# Patient Record
Sex: Female | Born: 1950 | Race: Black or African American | Hispanic: No | Marital: Single | Smoking: Never smoker
Health system: Southern US, Community
[De-identification: ages and names within clinical notes are randomized; demographics above are authoritative.]

## PROBLEM LIST (undated history)

## (undated) DIAGNOSIS — E049 Nontoxic goiter, unspecified: Secondary | ICD-10-CM

## (undated) DIAGNOSIS — E039 Hypothyroidism, unspecified: Secondary | ICD-10-CM

## (undated) DIAGNOSIS — T4145XA Adverse effect of unspecified anesthetic, initial encounter: Secondary | ICD-10-CM

## (undated) DIAGNOSIS — M1990 Unspecified osteoarthritis, unspecified site: Secondary | ICD-10-CM

## (undated) DIAGNOSIS — K7689 Other specified diseases of liver: Secondary | ICD-10-CM

## (undated) DIAGNOSIS — Z8679 Personal history of other diseases of the circulatory system: Secondary | ICD-10-CM

## (undated) DIAGNOSIS — Z9889 Other specified postprocedural states: Secondary | ICD-10-CM

## (undated) DIAGNOSIS — R112 Nausea with vomiting, unspecified: Secondary | ICD-10-CM

## (undated) DIAGNOSIS — E8809 Other disorders of plasma-protein metabolism, not elsewhere classified: Secondary | ICD-10-CM

## (undated) HISTORY — PX: HERNIA REPAIR: SHX51

## (undated) HISTORY — DX: Other specified diseases of liver: K76.89

## (undated) HISTORY — PX: ANKLE SURGERY: SHX546

## (undated) HISTORY — DX: Nontoxic goiter, unspecified: E04.9

## (undated) HISTORY — DX: Personal history of other diseases of the circulatory system: Z86.79

## (undated) HISTORY — DX: Unspecified osteoarthritis, unspecified site: M19.90

---

## 2009-10-13 DIAGNOSIS — K7689 Other specified diseases of liver: Secondary | ICD-10-CM

## 2009-10-13 HISTORY — DX: Other specified diseases of liver: K76.89

## 2010-10-15 LAB — HM COLONOSCOPY

## 2015-06-19 LAB — HM MAMMOGRAPHY

## 2015-10-10 ENCOUNTER — Telehealth: Payer: Self-pay | Admitting: Family Medicine

## 2015-10-10 NOTE — Telephone Encounter (Signed)
Medical records received and being sent back appt 10/22/15

## 2015-10-22 ENCOUNTER — Encounter: Payer: Self-pay | Admitting: Internal Medicine

## 2015-10-22 ENCOUNTER — Encounter: Payer: Self-pay | Admitting: Family Medicine

## 2015-10-22 ENCOUNTER — Ambulatory Visit (INDEPENDENT_AMBULATORY_CARE_PROVIDER_SITE_OTHER): Payer: Federal, State, Local not specified - PPO | Admitting: Family Medicine

## 2015-10-22 VITALS — BP 124/90 | HR 64 | Ht 66.0 in | Wt 162.0 lb

## 2015-10-22 DIAGNOSIS — M545 Low back pain, unspecified: Secondary | ICD-10-CM | POA: Insufficient documentation

## 2015-10-22 DIAGNOSIS — E049 Nontoxic goiter, unspecified: Secondary | ICD-10-CM

## 2015-10-22 DIAGNOSIS — Z7189 Other specified counseling: Secondary | ICD-10-CM

## 2015-10-22 DIAGNOSIS — G8929 Other chronic pain: Secondary | ICD-10-CM

## 2015-10-22 DIAGNOSIS — Z7689 Persons encountering health services in other specified circumstances: Secondary | ICD-10-CM

## 2015-10-22 NOTE — Patient Instructions (Signed)
[facility] will contact you in the next few days.  Ache ibuprofen 800 mg 3 times daily with food. You can also continue taking Tylenol I recommend heat and stretching. Back Pain, Adult Back pain is very common in adults.The cause of back pain is rarely dangerous and the pain often gets better over time.The cause of your back pain may not be known. Some common causes of back pain include:  Strain of the muscles or ligaments supporting the spine.  Wear and tear (degeneration) of the spinal disks.  Arthritis.  Direct injury to the back. For many people, back pain may return. Since back pain is rarely dangerous, most people can learn to manage this condition on their own. HOME CARE INSTRUCTIONS Watch your back pain for any changes. The following actions may help to lessen any discomfort you are feeling:  Remain active. It is stressful on your back to sit or stand in one place for long periods of time. Do not sit, drive, or stand in one place for more than 30 minutes at a time. Take short walks on even surfaces as soon as you are able.Try to increase the length of time you walk each day.  Exercise regularly as directed by your health care provider. Exercise helps your back heal faster. It also helps avoid future injury by keeping your muscles strong and flexible.  Do not stay in bed.Resting more than 1-2 days can delay your recovery.  Pay attention to your body when you bend and lift. The most comfortable positions are those that put less stress on your recovering back. Always use proper lifting techniques, including:  Bending your knees.  Keeping the load close to your body.  Avoiding twisting.  Find a comfortable position to sleep. Use a firm mattress and lie on your side with your knees slightly bent. If you lie on your back, put a pillow under your knees.  Avoid feeling anxious or stressed.Stress increases muscle tension and can worsen back pain.It is  important to recognize when you are anxious or stressed and learn ways to manage it, such as with exercise.  Take medicines only as directed by your health care provider. Over-the-counter medicines to reduce pain and inflammation are often the most helpful.Your health care provider may prescribe muscle relaxant drugs.These medicines help dull your pain so you can more quickly return to your normal activities and healthy exercise.  Apply ice to the injured area:  Put ice in a plastic bag.  Place a towel between your skin and the bag.  Leave the ice on for 20 minutes, 2-3 times a day for the first 2-3 days. After that, ice and heat may be alternated to reduce pain and spasms.  Maintain a healthy weight. Excess weight puts extra stress on your back and makes it difficult to maintain good posture. SEEK MEDICAL CARE IF:  You have pain that is not relieved with rest or medicine.  You have increasing pain going down into the legs or buttocks.  You have pain that does not improve in one week.  You have night pain.  You lose weight.  You have a fever or chills. SEEK IMMEDIATE MEDICAL CARE IF:   You develop new bowel or bladder control problems.  You have unusual weakness or numbness in your arms or legs.  You develop nausea or vomiting.  You develop abdominal pain.  You feel faint.   This information is not intended to replace advice given to you by your health  care provider. Make sure you discuss any questions you have with your health care provider.   Document Released: 09/29/2005 Document Revised: 10/20/2014 Document Reviewed: 01/31/2014 Elsevier Interactive Patient Education [employer]
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
## 2015-11-01 ENCOUNTER — Encounter: Payer: Self-pay | Admitting: Family Medicine

## 2015-11-28 ENCOUNTER — Encounter: Payer: Self-pay | Admitting: Family Medicine

## 2015-12-06 ENCOUNTER — Telehealth: Payer: Self-pay | Admitting: Family Medicine

## 2015-12-06 NOTE — Telephone Encounter (Signed)
Pt called and was wondering about her handicap placard said she brought it at her first visit, and was wanting it signed so she could take it to the [facility], i didn't see it any where, and didn't see any notes anywhere about it, she states she brought it in with some other paperwork on her first visit and i put the paperwork back when she checked in that day so im not for sure where its at, or if she mentioned it that day, but she says she needs one filled out that she can not walk that far and she needs to park close. i looked in media to see if it was in there but didn't see it either. Pt was kinda upset that she hasn't heard anything about the letter.please advise patient. Pt can be reached at [phone] told pt you was out of the office today and it would be tomorrow before someone would get back to her.

## 2015-12-07 NOTE — Telephone Encounter (Signed)
Left message for pt to call me back 

## 2015-12-07 NOTE — Telephone Encounter (Signed)
Please call and let her know that she and I discussed this at her last visit and that she did not meet criteria required for me to sign off on a handicap placard. In fact, she and I discussed how she is trying to walk more. I do not think this is appropriate and she does not meet the criteria that I would have to sign off on.

## 2015-12-11 NOTE — Telephone Encounter (Signed)
Left message for pt to call me back 

## 2015-12-14 ENCOUNTER — Encounter: Payer: Self-pay | Admitting: Family Medicine

## 2015-12-20 ENCOUNTER — Telehealth: Payer: Self-pay | Admitting: Family Medicine
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
## 2015-12-20 NOTE — Telephone Encounter (Signed)
Pt returned call to [place] °

## 2015-12-21 NOTE — Telephone Encounter (Signed)
I recommend that she come back in for an office visit to discuss starting her on medication for pain, I have not seen her since 10/22/2015. As I discussed with her in the office, she does not meet criteria to receive a handicap placard in this state and I an unable to sign off on her getting one. She and I discussed that she is actually walking quite a bit and that this is good for her to keep doing this.

## 2015-12-21 NOTE — Telephone Encounter (Signed)
Pt was told of Angela Patrick's recommendations and got upset as she doesn't meet the criteria for handicap placard and she has had one 2-3 times before and she just don't understand why she had it then but not now. i said its noted in your chart that you are walking 5 miles a day, is that correct and she said "yes i try to walk 5 miles a day as she wanted me to go to PT and so im trying to do it on my own". i said well if you can walk 5 miles a day as you are stating you can, then you do not need a handicap placard. She said well i still am having pain when i walk and she said well i will just schedule an appt and will talk about it then.

## 2016-02-07 ENCOUNTER — Ambulatory Visit (INDEPENDENT_AMBULATORY_CARE_PROVIDER_SITE_OTHER): Payer: Federal, State, Local not specified - PPO

## 2016-02-07 ENCOUNTER — Ambulatory Visit (INDEPENDENT_AMBULATORY_CARE_PROVIDER_SITE_OTHER): Payer: Federal, State, Local not specified - PPO | Admitting: Physician Assistant

## 2016-02-07 VITALS — BP 122/80 | HR 59 | Temp 98.2°F | Resp 16 | Ht 65.0 in | Wt 159.0 lb

## 2016-02-07 DIAGNOSIS — M542 Cervicalgia: Secondary | ICD-10-CM

## 2016-02-07 DIAGNOSIS — M5412 Radiculopathy, cervical region: Secondary | ICD-10-CM | POA: Diagnosis not present

## 2016-02-07 DIAGNOSIS — Z139 Encounter for screening, unspecified: Secondary | ICD-10-CM | POA: Diagnosis not present

## 2016-02-07 DIAGNOSIS — M47812 Spondylosis without myelopathy or radiculopathy, cervical region: Secondary | ICD-10-CM

## 2016-02-07 LAB — GLUCOSE, POCT (MANUAL RESULT ENTRY): POC Glucose: 103 mg/dl — AB (ref 70–99)

## 2016-02-07 MED ORDER — ACETAMINOPHEN 500 MG PO TABS: 1000.0000 mg | ORAL_TABLET | Freq: Three times a day (TID) | ORAL | Status: DC | PRN

## 2016-02-07 MED ORDER — PREDNISONE 20 MG PO TABS: ORAL_TABLET | ORAL | Status: AC

## 2016-02-07 MED ORDER — CYCLOBENZAPRINE HCL 10 MG PO TABS: 5.0000 mg | ORAL_TABLET | Freq: Three times a day (TID) | ORAL | Status: DC | PRN

## 2016-02-07 NOTE — Patient Instructions (Signed)
     IF you received an x-ray today, you will receive an invoice from [facility]. Please contact [facility] at [phone] with questions or concerns regarding your invoice.   IF you received labwork today, you will receive an invoice from [facility]. Please contact [facility] at [phone] with questions or concerns regarding your invoice.   Our billing staff will not be able to assist you with questions regarding bills from these companies.  You will be contacted with the lab results as soon as they are available. The fastest way to get your results is to activate your My Chart account. Instructions are located on the last page of this paperwork. If you have not heard from us regarding the results in 2 weeks, please contact this office.      

## 2016-02-07 NOTE — Progress Notes (Signed)
02/07/2016 12:59 PM   DOB: 09/18/1951 / MRN: [id number]  SUBJECTIVE:  Angela Patrick is a 65 y.o. female presenting for neck pain. Reports this started as a "crick in the neck." Reports over the last month she has developed some tingling in the left upper extremity.  Feels that she is getting worse.  She has tried OTC ibuprofen for the pain without relief.  No history of diabetes.   She is allergic to iodine and penicillins.   She  has a past medical history of Enlarged thyroid; Arthritis; Benign liver cyst (2011); Goiter; and History of orthostatic hypotension.    She  reports that she has never smoked. She has never used smokeless tobacco. She reports that she drinks alcohol. She reports that she does not use illicit drugs. She  has no sexual activity history on file. The patient  has past surgical history that includes Ankle surgery (Right).  Her family history includes AAA (abdominal aortic aneurysm) in her father; CAD in her mother; Cancer in her mother and sister; Goiter in her mother.  Review of Systems  Constitutional: Negative for fever and chills.  Eyes: Negative for blurred vision.  Respiratory: Negative for cough and shortness of breath.   Cardiovascular: Negative for chest pain.  Gastrointestinal: Negative for nausea and abdominal pain.  Genitourinary: Negative for dysuria, urgency and frequency.  Musculoskeletal: Positive for myalgias and neck pain.  Skin: Negative for rash.  Neurological: Positive for tingling. Negative for dizziness, sensory change, focal weakness and headaches.  Psychiatric/Behavioral: Negative for depression. The patient is not nervous/anxious.     Problem list and medications reviewed and updated by myself where necessary, and exist elsewhere in the encounter.   OBJECTIVE:  BP 122/80 mmHg  Pulse 59  Temp(Src) 98.2 F (36.8 C) (Oral)  Resp 16  Ht  (1.651 m)  Wt 159 lb (72.122 kg)  BMI 26.46 kg/m2  SpO2 98%  Physical Exam    Constitutional: She is oriented to person, place, and time. She appears well-developed and well-nourished.  Cardiovascular: Normal rate and regular rhythm.   Pulmonary/Chest: Effort normal and breath sounds normal.  Musculoskeletal:       Back:  Neurological: She is alert and oriented to person, place, and time. She has normal strength and normal reflexes. She displays no atrophy. No cranial nerve deficit or sensory deficit. She displays a negative Romberg sign. Gait normal. GCS eye subscore is 4. GCS verbal subscore is 5. GCS motor subscore is 6.  Skin: Skin is warm and dry.    Results for orders placed or performed in visit on 02/07/16 (from the past 72 hour(s))  POCT glucose (manual entry)     Status: Abnormal   Collection Time: 02/07/16 12:28 PM  Result Value Ref Range   POC Glucose 103 (A) 70 - 99 mg/dl    Dg Cervical Spine Complete  02/07/2016  CLINICAL DATA:  Right-sided neck discomfort with right upper extremity radicular symptoms. EXAM: CERVICAL SPINE - COMPLETE 4+ VIEW COMPARISON:  None in PACs FINDINGS: The cervical vertebral bodies are preserved in height. There is mild disc space narrowing at C6-7 and to a lesser extent at C5-6. There small anterior endplate osteophytes at C4-5 and C5-6. There is no spondylolisthesis. There is no perched facet or spinous process fracture. The oblique views reveal no significant bony encroachment upon the neural foramina. The odontoid is intact. The prevertebral soft tissue spaces are normal. IMPRESSION: There is degenerative disc disease centered at C5-6 and C6-7. There  is no compression fracture or significant bony encroachment upon the neural foramina. Electronically Signed   By: David  [place] M.D.   On: 02/07/2016 12:52    ASSESSMENT AND PLAN  Angela Patrick was seen today for back pain, arm pain, shoulder pain and other.  Diagnoses and all orders for this visit:  Neck pain: Her symptoms, exam, and rads are consistent with neck pain.  Will treat  with dose pack (non fasting sugar wnl today).  Advised Tylenol and advised that she stop taking Ibuprofen 800.  Of note patient has tried three of her friend's gabapentin and says this helped some.  This may be a consideration for treatment in the future.  Of note, patient did want me to complete a handicap placard for her today.  I advised it would be best for her PCP Angela Patrick to complete this information for her, as best I can tell the condition I treated today does not qualify her for a handicap placard as she is ambulating without difficulty.  -     DG Cervical Spine Complete; Future -     cyclobenzaprine (FLEXERIL) 10 MG tablet; Take 0.5-1 tablets (5-10 mg total) by mouth 3 (three) times daily as needed for muscle spasms (Do no operate heavy machinery while taking.).  Radiculopathy of cervical region -     DG Cervical Spine Complete; Future -     predniSONE (DELTASONE) 20 MG tablet; Take 3 in the morning for 3 days, then 2 in the morning for 3 days, and then 1 in the morning for 3 days.  Screening -     POCT glucose (manual entry)  Cervical spondylarthritis -     acetaminophen (TYLENOL) 500 MG tablet; Take 2 tablets (1,000 mg total) by mouth every 8 (eight) hours as needed.    The patient was advised to call or return to clinic if she does not see an improvement in symptoms or to seek the care of the closest emergency department if she worsens with the above plan.   Deliah BostonMichael Clark, MHS, PA-C [facility] 02/07/2016 12:59 PM

## 2016-09-25 ENCOUNTER — Encounter: Payer: Self-pay | Admitting: Family Medicine

## 2016-09-25 ENCOUNTER — Ambulatory Visit (INDEPENDENT_AMBULATORY_CARE_PROVIDER_SITE_OTHER): Payer: Federal, State, Local not specified - PPO | Admitting: Family Medicine

## 2016-09-25 ENCOUNTER — Ambulatory Visit: Payer: Federal, State, Local not specified - PPO | Admitting: Family Medicine

## 2016-09-25 ENCOUNTER — Ambulatory Visit
Admission: RE | Admit: 2016-09-25 | Discharge: 2016-09-25 | Disposition: A | Discharge status: Home / Self Care | Payer: Federal, State, Local not specified - PPO | Source: Ambulatory Visit | Attending: Family Medicine | Admitting: Family Medicine

## 2016-09-25 VITALS — BP 130/80 | HR 80 | Temp 98.4°F | Resp 16 | Wt 151.6 lb

## 2016-09-25 DIAGNOSIS — M25551 Pain in right hip: Secondary | ICD-10-CM

## 2016-09-25 DIAGNOSIS — R109 Unspecified abdominal pain: Secondary | ICD-10-CM | POA: Diagnosis not present

## 2016-09-25 DIAGNOSIS — J019 Acute sinusitis, unspecified: Secondary | ICD-10-CM

## 2016-09-25 DIAGNOSIS — R1031 Right lower quadrant pain: Secondary | ICD-10-CM | POA: Diagnosis not present

## 2016-09-25 LAB — POCT URINALYSIS DIPSTICK
BILIRUBIN UA: NEGATIVE
Glucose, UA: NEGATIVE
KETONES UA: NEGATIVE
LEUKOCYTES UA: NEGATIVE
Nitrite, UA: NEGATIVE
PH UA: 6
PROTEIN UA: NEGATIVE
RBC UA: NEGATIVE
Urobilinogen, UA: NEGATIVE

## 2016-09-25 MED ORDER — DOXYCYCLINE HYCLATE 100 MG PO TABS: 100.0000 mg | ORAL_TABLET | Freq: Two times a day (BID) | ORAL | 0 refills | Status: DC

## 2016-09-25 MED ORDER — IBUPROFEN 800 MG PO TABS: 800.0000 mg | ORAL_TABLET | Freq: Three times a day (TID) | ORAL | 1 refills | Status: DC

## 2016-09-25 NOTE — Patient Instructions (Signed)
Try taking Claritin, Allegra, Xyzal for sneezing and drainage. You can also try Flonase nasal spray. Stay well hydrated. Take the antibiotic until it's complete. If you are not back to baseline in regards to your sinus issues, let me know.    I am sending you for an XR and will call you with results. Take Ibuprofen 800 mg every 8 hours with food for the next couple of weeks and try heat to the area 2-3 times per day. Let me know if you are not improving or notice any new symptoms.    Sinusitis, Adult Sinusitis is soreness and inflammation of your sinuses. Sinuses are hollow spaces in the bones around your face. Your sinuses are located:  Around your eyes.  In the middle of your forehead.  Behind your nose.  In your cheekbones. Your sinuses and nasal passages are lined with a stringy fluid (mucus). Mucus normally drains out of your sinuses. When your nasal tissues become inflamed or swollen, the mucus can become trapped or blocked so air cannot flow through your sinuses. This allows bacteria, viruses, and funguses to grow, which leads to infection. Sinusitis can develop quickly and last for 7?10 days (acute) or for more than 12 weeks (chronic). Sinusitis often develops after a cold. What are the causes? This condition is caused by anything that creates swelling in the sinuses or stops mucus from draining, including:  Allergies.  Asthma.  Bacterial or viral infection.  Abnormally shaped bones between the nasal passages.  Nasal growths that contain mucus (nasal polyps).  Narrow sinus openings.  Pollutants, such as chemicals or irritants in the air.  A foreign object stuck in the nose.  A fungal infection. This is rare. What increases the risk? The following factors may make you more likely to develop this condition:  Having allergies or asthma.  Having had a recent cold or respiratory tract infection.  Having structural deformities or blockages in your nose or  sinuses.  Having a weak immune system.  Doing a lot of swimming or diving.  Overusing nasal sprays.  Smoking. What are the signs or symptoms? The main symptoms of this condition are pain and a feeling of pressure around the affected sinuses. Other symptoms include:  Upper toothache.  Earache.  Headache.  Bad breath.  Decreased sense of smell and taste.  A cough that may get worse at night.  Fatigue.  Fever.  Thick drainage from your nose. The drainage is often green and it may contain pus (purulent).  Stuffy nose or congestion.  Postnasal drip. This is when extra mucus collects in the throat or back of the nose.  Swelling and warmth over the affected sinuses.  Sore throat.  Sensitivity to light. How is this diagnosed? This condition is diagnosed based on symptoms, a medical history, and a physical exam. To find out if your condition is acute or chronic, your health care provider may:  Look in your nose for signs of nasal polyps.  Tap over the affected sinus to check for signs of infection.  View the inside of your sinuses using an imaging device that has a light attached (endoscope). If your health care provider suspects that you have chronic sinusitis, you may also:  Be tested for allergies.  Have a sample of mucus taken from your nose (nasal culture) and checked for bacteria.  Have a mucus sample examined to see if your sinusitis is related to an allergy. If your sinusitis does not respond to treatment and it lasts  longer than 8 weeks, you may have an MRI or CT scan to check your sinuses. These scans also help to determine how severe your infection is. In rare cases, a bone biopsy may be done to rule out more serious types of fungal sinus disease. How is this treated? Treatment for sinusitis depends on the cause and whether your condition is chronic or acute. If a virus is causing your sinusitis, your symptoms will go away on their own within 10 days. You  may be given medicines to relieve your symptoms, including:  Topical nasal decongestants. They shrink swollen nasal passages and let mucus drain from your sinuses.  Antihistamines. These drugs block inflammation that is triggered by allergies. This can help to ease swelling in your nose and sinuses.  Topical nasal corticosteroids. These are nasal sprays that ease inflammation and swelling in your nose and sinuses.  Nasal saline washes. These rinses can help to get rid of thick mucus in your nose. If your condition is caused by bacteria, you will be given an antibiotic medicine. If your condition is caused by a fungus, you will be given an antifungal medicine. Surgery may be needed to correct underlying conditions, such as narrow nasal passages. Surgery may also be needed to remove polyps. Follow these instructions at home: Medicines  Take, use, or apply over-the-counter and prescription medicines only as told by your health care provider. These may include nasal sprays.  If you were prescribed an antibiotic medicine, take it as told by your health care provider. Do not stop taking the antibiotic even if you start to feel better. Hydrate and Humidify  Drink enough water to keep your urine clear or pale yellow. Staying hydrated will help to thin your mucus.  Use a cool mist humidifier to keep the humidity level in your home above 50%.  Inhale steam for 10-15 minutes, 3-4 times a day or as told by your health care provider. You can do this in the bathroom while a hot shower is running.  Limit your exposure to cool or dry air. Rest  Rest as much as possible.  Sleep with your head raised (elevated).  Make sure to get enough sleep each night. General instructions  Apply a warm, moist washcloth to your face 3-4 times a day or as told by your health care provider. This will help with discomfort.  Wash your hands often with soap and water to reduce your exposure to viruses and other  germs. If soap and water are not available, use hand sanitizer.  Do not smoke. Avoid being around people who are smoking (secondhand smoke).  Keep all follow-up visits as told by your health care provider. This is important. Contact a health care provider if:  You have a fever.  Your symptoms get worse.  Your symptoms do not improve within 10 days. Get help right away if:  You have a severe headache.  You have persistent vomiting.  You have pain or swelling around your face or eyes.  You have vision problems.  You develop confusion.  Your neck is stiff.  You have trouble breathing. This information is not intended to replace advice given to you by your health care provider. Make sure you discuss any questions you have with your health care provider. Document Released: 09/29/2005 Document Revised: 05/25/2016 Document Reviewed: 07/25/2015 Elsevier Interactive Patient Education  2017 [employer]

## 2016-09-25 NOTE — Progress Notes (Signed)
Subjective:    Patient ID: Angela Patrick, female    DOB: 03/28/1951, 65 y.o.   MRN: [id number]  HPI Chief Complaint  Patient presents with  . sick    sinus- since thanskgiving, sinus pressure, headache, cough,.   . side pain    right side pain and radiates down leg to back of hip   She is here with complaints of a 3 week history of sinus pressure, purulent nasal drainage, post nasal drainage, fatigue coughing at night. Sneezing a lot during the day.   Does not smoke. No history of asthma, bronchitis, pneumonia.   Has tried multiple OTC cold remedies.  Denies fever, chills, chest pain, palpitations, shortness of breathing, wheezing, N/V/D.   States she has noticed some urinary frequency but has been drinking probiotic tea. Denies dysuria, urgency.   Also complains of right lower abdominal pain since early November. Pain is constant and aching and occasionally shoots down the front of her right leg and around to her right flank. No known injury but she was coughing when pain started. Pain is worse with movement and laying on her right side at night. Pain is improved with rest.  Denies vaginal discharge. No changes in bowel habits.  Last colonoscopy was at age 65.   Reports history of hernia repair in the same area over 20 years ago.   Past Medical History:  Diagnosis Date  . Arthritis    osteoarthritis  . Benign liver cyst 2011  . Enlarged thyroid   . Goiter    ultrasound in 2014 with fine needle aspiration  . History of orthostatic hypotension    Reviewed allergies, medications, past medical, surgical,  and social history.   Review of Systems Pertinent positives and negatives in the history of present illness.     Objective:   Physical Exam  Constitutional: She is oriented to person, place, and time. She appears well-developed and well-nourished. She does not have a sickly appearance. No distress.  HENT:  Right Ear: Tympanic membrane and ear canal normal.  Left Ear:  Tympanic membrane and ear canal normal.  Nose: Mucosal edema and rhinorrhea present. Right sinus exhibits maxillary sinus tenderness and frontal sinus tenderness. Left sinus exhibits maxillary sinus tenderness and frontal sinus tenderness.  Mouth/Throat: Uvula is midline, oropharynx is clear and moist and mucous membranes are normal.  Eyes: Conjunctivae are normal.  Neck: Full passive range of motion without pain. Neck supple.  Cardiovascular: Normal rate, regular rhythm, normal heart sounds and intact distal pulses.   No LE edema  Pulmonary/Chest: Effort normal and breath sounds normal.  Abdominal: Soft. Normal appearance and bowel sounds are normal. There is no hepatosplenomegaly. There is tenderness in the right upper quadrant and right lower quadrant. There is no rigidity, no rebound, no guarding, no CVA tenderness, no tenderness at McBurney's point and negative Murphy's sign. No hernia.  Genitourinary:  Genitourinary Comments: declined  Musculoskeletal:       Right hip: She exhibits tenderness. She exhibits normal range of motion, normal strength and no swelling.  Pain to posterior and anterior right hip with internal and external rotation.   Lymphadenopathy:    She has no cervical adenopathy.       Right: No inguinal and no supraclavicular adenopathy present.       Left: No inguinal and no supraclavicular adenopathy present.  Neurological: She is alert and oriented to person, place, and time. She has normal strength. Gait normal.  Skin: Skin is warm and dry. No  rash noted. No pallor.  Psychiatric: She has a normal mood and affect. Her speech is normal and behavior is normal. Thought content normal.   BP 130/80   Pulse 80   Temp 98.4 F (36.9 C) (Oral)   Resp 16   Wt 151 lb 9.6 oz (68.8 kg)   SpO2 98%   BMI 25.23 kg/m   Urinalysis dipstick negative.      Assessment & Plan:  Acute sinusitis with symptoms > 10 days  Pain of right hip joint - Plan: DG HIP UNILAT WITH PELVIS  2-3 VIEWS RIGHT  Right lower quadrant abdominal pain - Plan: DG HIP UNILAT WITH PELVIS 2-3 VIEWS RIGHT  Right flank pain - Plan: POCT urinalysis dipstick   Discussed management of acute sinusitis with nasal spray and antihistamine. Prescribed Doxycycline. She will follow up if not back to baseline after completing the antibiotic. I do suspect an allergy component to her illness.   Plan to send her for an XR of her right hip. Recommend conservative treatment with heat and Ibuprofen 800 mg TID for the next 2 weeks. Will follow up pending XR result. She does have chronic lumbar pain but this seems to under control.  Discussed that her lower abdominal pain has been ongoing and unchanged and is most likely referred from her hip but discussed red flags of abdominal pain.  She will be due for a CPE after October 21 2016.

## 2016-09-26 ENCOUNTER — Telehealth: Payer: Self-pay | Admitting: Internal Medicine
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
## 2016-09-28 NOTE — Telephone Encounter (Signed)
Please make sure she knows that I sent in a prescription for Ibuprofen 800 mg at her last appointment. I am aware of the fact that she has a history of chronic back pain.  Find out if she has additional questions. Thanks.

## 2016-09-29 NOTE — Telephone Encounter (Signed)
Left message for pt to call me back 

## 2017-01-08 ENCOUNTER — Telehealth: Payer: Self-pay

## 2017-01-08 NOTE — Telephone Encounter (Signed)
[facility] [phone].

## 2017-01-19 ENCOUNTER — Other Ambulatory Visit: Payer: Self-pay | Admitting: Internal Medicine

## 2017-01-19 DIAGNOSIS — E042 Nontoxic multinodular goiter: Secondary | ICD-10-CM

## 2017-01-26 ENCOUNTER — Ambulatory Visit (HOSPITAL_COMMUNITY): Payer: Federal, State, Local not specified - PPO

## 2017-02-23 ENCOUNTER — Ambulatory Visit (HOSPITAL_COMMUNITY)
Admission: RE | Admit: 2017-02-23 | Discharge: 2017-02-23 | Disposition: A | Discharge status: Home / Self Care | Payer: Federal, State, Local not specified - PPO | Source: Ambulatory Visit | Attending: Internal Medicine | Admitting: Internal Medicine

## 2017-02-23 DIAGNOSIS — E042 Nontoxic multinodular goiter: Secondary | ICD-10-CM | POA: Insufficient documentation

## 2017-10-07 ENCOUNTER — Emergency Department (HOSPITAL_COMMUNITY)
Admission: EM | Admit: 2017-10-07 | Discharge: 2017-10-07 | Disposition: A | Discharge status: Home / Self Care | Payer: Federal, State, Local not specified - PPO | Attending: Physician Assistant | Admitting: Physician Assistant

## 2017-10-07 ENCOUNTER — Other Ambulatory Visit: Payer: Self-pay

## 2017-10-07 ENCOUNTER — Emergency Department (HOSPITAL_COMMUNITY): Payer: Federal, State, Local not specified - PPO

## 2017-10-07 ENCOUNTER — Encounter (HOSPITAL_COMMUNITY): Payer: Self-pay | Admitting: Emergency Medicine

## 2017-10-07 DIAGNOSIS — Y9389 Activity, other specified: Secondary | ICD-10-CM | POA: Insufficient documentation

## 2017-10-07 DIAGNOSIS — S199XXA Unspecified injury of neck, initial encounter: Secondary | ICD-10-CM | POA: Diagnosis present

## 2017-10-07 DIAGNOSIS — S39012A Strain of muscle, fascia and tendon of lower back, initial encounter: Secondary | ICD-10-CM | POA: Insufficient documentation

## 2017-10-07 DIAGNOSIS — Z79899 Other long term (current) drug therapy: Secondary | ICD-10-CM | POA: Diagnosis not present

## 2017-10-07 DIAGNOSIS — S161XXA Strain of muscle, fascia and tendon at neck level, initial encounter: Secondary | ICD-10-CM

## 2017-10-07 DIAGNOSIS — Y92410 Unspecified street and highway as the place of occurrence of the external cause: Secondary | ICD-10-CM | POA: Insufficient documentation

## 2017-10-07 DIAGNOSIS — Y998 Other external cause status: Secondary | ICD-10-CM | POA: Diagnosis not present

## 2017-10-07 MED ORDER — IBUPROFEN 800 MG PO TABS: 800.0000 mg | ORAL_TABLET | Freq: Three times a day (TID) | ORAL | 0 refills | Status: DC | PRN

## 2017-10-07 MED ORDER — CYCLOBENZAPRINE HCL 10 MG PO TABS: 10.0000 mg | ORAL_TABLET | Freq: Every day | ORAL | 0 refills | Status: DC

## 2017-10-07 MED ORDER — HYDROCODONE-ACETAMINOPHEN 5-325 MG PO TABS: 1.0000 | ORAL_TABLET | Freq: Four times a day (QID) | ORAL | 0 refills | Status: DC | PRN
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
## 2017-10-07 NOTE — Discharge Instructions (Signed)
Return here as needed.  Use ice and heat on the areas that are sore.  Follow-up with your primary doctor.

## 2017-10-07 NOTE — ED Notes (Signed)
Spoke with PA Laveda Normanran regarding patient and given orders for CT head/neck. Orders placed accordingly.

## 2018-02-10 DIAGNOSIS — T8859XA Other complications of anesthesia, initial encounter: Secondary | ICD-10-CM

## 2018-02-10 HISTORY — DX: Other complications of anesthesia, initial encounter: T88.59XA

## 2018-03-17 ENCOUNTER — Other Ambulatory Visit: Payer: Self-pay | Admitting: Internal Medicine

## 2018-03-17 DIAGNOSIS — Z808 Family history of malignant neoplasm of other organs or systems: Secondary | ICD-10-CM

## 2018-03-17 DIAGNOSIS — E042 Nontoxic multinodular goiter: Secondary | ICD-10-CM

## 2018-04-19 ENCOUNTER — Ambulatory Visit
Admission: RE | Admit: 2018-04-19 | Discharge: 2018-04-19 | Disposition: A | Discharge status: Home / Self Care | Payer: Federal, State, Local not specified - PPO | Source: Ambulatory Visit | Attending: Internal Medicine | Admitting: Internal Medicine

## 2018-04-19 DIAGNOSIS — E042 Nontoxic multinodular goiter: Secondary | ICD-10-CM

## 2018-04-19 DIAGNOSIS — Z808 Family history of malignant neoplasm of other organs or systems: Secondary | ICD-10-CM

## 2018-07-05 ENCOUNTER — Ambulatory Visit: Payer: Self-pay | Admitting: Surgery

## 2018-08-20 NOTE — Patient Instructions (Addendum)
Angela Patrick  08/20/2018   Your procedure is scheduled on: 08-26-18     Report to [facility] Main  Entrance    Report to Admitting at 7:30 AM    Call this number if you have problems the morning of surgery [phone]     Remember: Do not eat food or drink liquids :After Midnight.     Take these medicines the morning of surgery with A SIP OF WATER: None               BRUSH YOUR TEETH MORNING OF SURGERY AND RINSE YOUR MOUTH OUT, NO CHEWING GUM CANDY OR MINTS.                 You may not have any metal on your body including hair pins and              piercings  Do not wear jewelry, make-up, lotions, powders or perfumes, deodorant             Do not wear nail polish.  Do not shave  48 hours prior to surgery.     Do not bring valuables to the hospital. [facility] IS NOT             RESPONSIBLE   FOR VALUABLES.  Contacts, dentures or bridgework may not be worn into surgery.  Leave suitcase in the car. After surgery it may be brought to your room.   Special Instructions: N/A              Please read over the following fact sheets you were given: _____________________________________________________________________          [facility] - Preparing for Surgery Before surgery, you can play an important role.  Because skin is not sterile, your skin needs to be as free of germs as possible.  You can reduce the number of germs on your skin by washing with CHG (chlorahexidine gluconate) soap before surgery.  CHG is an antiseptic cleaner which kills germs and bonds with the skin to continue killing germs even after washing. Please DO NOT use if you have an allergy to CHG or antibacterial soaps.  If your skin becomes reddened/irritated stop using the CHG and inform your nurse when you arrive at Short Stay. Do not shave (including legs and underarms) for at least 48 hours prior to the first CHG shower.  You may shave your face/neck. Please follow these instructions  carefully:  1.  Shower with CHG Soap the night before surgery and the  morning of Surgery.  2.  If you choose to wash your hair, wash your hair first as usual with your  normal  shampoo.  3.  After you shampoo, rinse your hair and body thoroughly to remove the  shampoo.                           4.  Use CHG as you would any other liquid soap.  You can apply chg directly  to the skin and wash                       Gently with a scrungie or clean washcloth.  5.  Apply the CHG Soap to your body ONLY FROM THE NECK DOWN.   Do not use on face/ open  Wound or open sores. Avoid contact with eyes, ears mouth and genitals (private parts).                       Wash face,  Genitals (private parts) with your normal soap.             6.  Wash thoroughly, paying special attention to the area where your surgery  will be performed.  7.  Thoroughly rinse your body with warm water from the neck down.  8.  DO NOT shower/wash with your normal soap after using and rinsing off  the CHG Soap.                9.  Pat yourself dry with a clean towel.            10.  Wear clean pajamas.            11.  Place clean sheets on your bed the night of your first shower and do not  sleep with pets. Day of Surgery : Do not apply any lotions/deodorants the morning of surgery.  Please wear clean clothes to the hospital/surgery center.  FAILURE TO FOLLOW THESE INSTRUCTIONS MAY RESULT IN THE CANCELLATION OF YOUR SURGERY PATIENT SIGNATURE_________________________________  NURSE SIGNATURE__________________________________  ________________________________________________________________________

## 2018-08-23 ENCOUNTER — Ambulatory Visit (HOSPITAL_COMMUNITY)
Admission: RE | Admit: 2018-08-23 | Discharge: 2018-08-23 | Disposition: A | Discharge status: Home / Self Care | Payer: Federal, State, Local not specified - PPO | Source: Ambulatory Visit | Attending: Anesthesiology | Admitting: Anesthesiology

## 2018-08-23 ENCOUNTER — Encounter (HOSPITAL_COMMUNITY): Payer: Self-pay

## 2018-08-23 ENCOUNTER — Encounter (HOSPITAL_COMMUNITY): Payer: Self-pay | Admitting: Surgery

## 2018-08-23 ENCOUNTER — Encounter (HOSPITAL_COMMUNITY)
Admission: RE | Admit: 2018-08-23 | Discharge: 2018-08-23 | Disposition: A | Discharge status: Home / Self Care | Payer: Federal, State, Local not specified - PPO | Source: Ambulatory Visit | Attending: Surgery | Admitting: Surgery

## 2018-08-23 ENCOUNTER — Other Ambulatory Visit: Payer: Self-pay

## 2018-08-23 DIAGNOSIS — E042 Nontoxic multinodular goiter: Secondary | ICD-10-CM | POA: Diagnosis present

## 2018-08-23 DIAGNOSIS — Z01818 Encounter for other preprocedural examination: Secondary | ICD-10-CM | POA: Diagnosis present

## 2018-08-23 DIAGNOSIS — Z01811 Encounter for preprocedural respiratory examination: Secondary | ICD-10-CM

## 2018-08-23 HISTORY — DX: Adverse effect of unspecified anesthetic, initial encounter: T41.45XA

## 2018-08-23 LAB — BASIC METABOLIC PANEL
Anion gap: 7 (ref 5–15)
BUN: 16 mg/dL (ref 8–23)
CALCIUM: 9.1 mg/dL (ref 8.9–10.3)
CO2: 27 mmol/L (ref 22–32)
CREATININE: 0.98 mg/dL (ref 0.44–1.00)
Chloride: 106 mmol/L (ref 98–111)
GFR, EST NON AFRICAN AMERICAN: 59 mL/min — AB (ref >60)
Glucose, Bld: 98 mg/dL (ref 70–99)
Potassium: 3.8 mmol/L (ref 3.5–5.1)
SODIUM: 140 mmol/L (ref 135–145)

## 2018-08-23 LAB — CBC
HCT: 43.7 % (ref 36.0–46.0)
Hemoglobin: 14.2 g/dL (ref 12.0–15.0)
MCH: 28.8 pg (ref 26.0–34.0)
MCHC: 32.5 g/dL (ref 30.0–36.0)
MCV: 88.6 fL (ref 80.0–100.0)
PLATELETS: 319 10*3/uL (ref 150–400)
RBC: 4.93 MIL/uL (ref 3.87–5.11)
RDW: 12.9 % (ref 11.5–15.5)
WBC: 5.9 10*3/uL (ref 4.0–10.5)
nRBC: 0 % (ref 0.0–0.2)

## 2018-08-23 NOTE — H&P (Signed)
General Surgery [facility]  Angela Patrick DOB: 1951/08/29 Married / Language: English / Race: Black or African American Female  History of Present Illness   The patient is a 67 year old female who presents with a complaint of Enlarged thyroid.  CHIEF COMPLAINT: multinodular thyroid goiter with compressive symptoms  Patient is referred by Dr. Ronnette Hila for surgical evaluation and management of multinodular thyroid goiter with compressive symptoms. Patient's primary care physician is Dr. Leodis Sias. Patient has a history of long-standing multinodular thyroid goiter dating back to 2014. She was diagnosed by her physician in [place]. She underwent fine-needle aspiration biopsy and brought along with pathology report showing that this was benign. Patient has never been on thyroid medication. She has had no prior head or neck surgery. Thyroid has continued to enlarge. Most recent thyroid ultrasound performed in July 2019 shows an enlarged right thyroid lobe measuring 6.1 cm and an enlarged left thyroid lobe measuring 9.0 cm. There are multiple nodules but none of which were suspicious and require biopsy. Most recent TSH level is normal at 0.97. I describes compressive symptoms including nighttime shortness of breath and choking, globus sensation, chronic hoarseness, left ear pain, and solid food dysphagia. There is a family history of thyroid disease in the patient's sister who required treatment with radioactive iodine and the patient's mother who developed thyroid cancer. Patient presents today to discuss total thyroidectomy for management of multinodular thyroid goiter with compressive symptoms.   Past Surgical History Foot Surgery  Right.  Diagnostic Studies History Colonoscopy  5-10 years ago Mammogram  within last year Pap Smear  1-5 years ago  Allergies  Penicillins  Swelling. Betadine  Swelling.  Medication History Multivitamin (Oral)  Active. Turmeric (400MG  Capsule, Oral) Active. Medications Reconciled  Social History Alcohol use  Moderate alcohol use. Caffeine use  Coffee. No drug use  Tobacco use  Never smoker.  Family History Alcohol Abuse  Brother, Father, Mother, Sister. Arthritis  Brother, Family Members In General, Mother, Sister. Cancer  Mother. Cerebrovascular Accident  Family Members In General. Depression  Son. Diabetes Mellitus  Family Members In General. Heart Disease  Family Members In General. Heart disease in female family member before age 21  Hypertension  Brother, Family Members In [place], Father, Mother, Sister. Migraine Headache  Sister, Son. Seizure disorder  Sister, Son. Thyroid problems  Mother, Sister.  Pregnancy / Birth History Age at menarche  18 years. Contraceptive History  Oral contraceptives. Gravida  2 Irregular periods  Length (months) of breastfeeding  3-6 Maternal age  49-25 Para  2  Other Problems Anxiety Disorder  Arthritis  General anesthesia - complications  Hemorrhoids  Migraine Headache    Review of Systems General Present- Appetite Loss. Not Present- Chills, Fatigue, Fever, Night Sweats, Weight Gain and Weight Loss. Skin Not Present- Change in Wart/Mole, Dryness, Hives, Jaundice, New Lesions, Non-Healing Wounds, Rash and Ulcer. HEENT Present- Earache, Hoarseness, Ringing in the Ears, Seasonal Allergies, Sinus Pain, Sore Throat and Wears glasses/contact lenses. Not Present- Hearing Loss, Nose Bleed, Oral Ulcers, Visual Disturbances and Yellow Eyes. Respiratory Present- Difficulty Breathing and Snoring. Not Present- Bloody sputum, Chronic Cough and Wheezing. Cardiovascular Present- Difficulty Breathing Lying Down, Leg Cramps, Palpitations, Rapid Heart Rate, Shortness of Breath and Swelling of Extremities. Not Present- Chest Pain. Gastrointestinal Present- Constipation, Difficulty Swallowing, Excessive gas, Gets full quickly at  meals, Hemorrhoids, Indigestion and Rectal Pain. Not Present- Abdominal Pain, Bloating, Bloody Stool, Change in Bowel Habits, Chronic diarrhea, Nausea and Vomiting. Female Genitourinary  Present- Frequency and Urgency. Not Present- Nocturia, Painful Urination and Pelvic Pain. Musculoskeletal Present- Back Pain, Joint Pain, Joint Stiffness, Muscle Pain, Muscle Weakness and Swelling of Extremities. Neurological Present- Headaches, Numbness, Tingling, Tremor, Trouble walking and Weakness. Not Present- Decreased Memory, Fainting and Seizures. Psychiatric Present- Anxiety, Change in Sleep Pattern, Fearful and Frequent crying. Not Present- Bipolar and Depression. Endocrine Present- Cold Intolerance and Hair Changes. Not Present- Excessive Hunger, Heat Intolerance, Hot flashes and New Diabetes. Hematology Present- Easy Bruising. Not Present- Blood Thinners, Excessive bleeding, Gland problems, HIV and Persistent Infections.  Vitals Weight: 160.6 lb Height: 65in Body Surface Area: 1.8 m Body Mass Index: 26.72 kg/m  Temp.: 97.82F(Temporal)  Pulse: 76 (Regular)  BP: 116/74 (Sitting, Left Arm, Standard)  Physical Exam  See vital signs recorded above  GENERAL APPEARANCE Development: normal Nutritional status: normal Gross deformities: none  SKIN Rash, lesions, ulcers: none Induration, erythema: none Nodules: none palpable  EYES Conjunctiva and lids: normal Pupils: equal and reactive Iris: normal bilaterally  EARS, NOSE, MOUTH, THROAT External ears: no lesion or deformity External nose: no lesion or deformity Hearing: grossly normal Lips: no lesion or deformity Dentition: normal for age Oral mucosa: moist  NECK Symmetric: no Trachea: Slight deviation to the right Thyroid: Thyroid gland is enlarged, soft, and multinodular. Right thyroid lobe is slightly enlarged and nontender to palpation. Left thyroid lobe measures approximately 7-8 cm in greatest dimension and extends  well into the left neck and possibly behind the left clavicle. Patient does have intermittent hoarseness of voice which occurs relatively frequently during the course of our conversation.  CHEST Respiratory effort: normal Retraction or accessory muscle use: no Breath sounds: normal bilaterally Rales, rhonchi, wheeze: none  CARDIOVASCULAR Auscultation: regular rhythm, normal rate Murmurs: none Pulses: carotid and radial pulse 2+ palpable Lower extremity edema: none Lower extremity varicosities: none  MUSCULOSKELETAL Station and gait: normal Digits and nails: no clubbing or cyanosis Muscle strength: grossly normal all extremities Range of motion: grossly normal all extremities Deformity: none  LYMPHATIC Cervical: none palpable Supraclavicular: none palpable  PSYCHIATRIC Oriented to person, place, and time: yes Mood and affect: normal for situation Judgment and insight: appropriate for situation    Assessment & Plan  MULTIPLE THYROID NODULES (E04.2)  Pt Education - Pamphlet Given - The Thyroid Book: discussed with patient and provided information. Patient is referred by her endocrinologist for consideration for total thyroidectomy for management of multinodular goiter with compressive symptoms. Patient is provided with written literature on thyroid surgery to review at home.  Patient has an enlarged multinodular thyroid gland, larger on the left than on the right, with mild to moderate compressive symptoms. We have discussed the indications for thyroid surgery. We have discussed the operation of total thyroidectomy in the location of the surgical incision. We have discussed risk and benefits of the surgery including the potential for injury to recurrent laryngeal nerves with permanent hoarseness and injury to parathyroid glands causing low calcium levels. We have discussed the hospital stay to be anticipated. We have discussed her postoperative recovery. We have discussed  the need for lifelong thyroid hormone replacement. Patient understands and wishes to proceed with surgery in the near future.  The risks and benefits of the procedure have been discussed at length with the patient. The patient understands the proposed procedure, potential alternative treatments, and the course of recovery to be expected. All of the patient's questions have been answered at this time. The patient wishes to proceed with surgery.  Darnell Level, MD [facility] Office: [phone]

## 2018-08-24 NOTE — Progress Notes (Signed)
08-24-18 Pt advised that her scheduled surgery time is now 7:31AM instead of 9:30 AM. Pt to report to Admitting at 5:30 AM, and to remain NPO after midnight. Pt verbalized understanding.

## 2018-08-26 ENCOUNTER — Encounter (HOSPITAL_COMMUNITY)
Admission: RE | Disposition: A | Discharge status: Home / Self Care | Payer: Self-pay | Source: Ambulatory Visit | Attending: Surgery

## 2018-08-26 ENCOUNTER — Encounter (HOSPITAL_COMMUNITY): Payer: Self-pay

## 2018-08-26 ENCOUNTER — Other Ambulatory Visit: Payer: Self-pay

## 2018-08-26 ENCOUNTER — Observation Stay (HOSPITAL_COMMUNITY)
Admission: RE | Admit: 2018-08-26 | Discharge: 2018-08-27 | Disposition: A | Discharge status: Home / Self Care | Payer: Federal, State, Local not specified - PPO | Source: Ambulatory Visit | Attending: Surgery | Admitting: Surgery

## 2018-08-26 ENCOUNTER — Ambulatory Visit (HOSPITAL_COMMUNITY): Payer: Federal, State, Local not specified - PPO | Admitting: Certified Registered Nurse Anesthetist

## 2018-08-26 DIAGNOSIS — Z888 Allergy status to other drugs, medicaments and biological substances status: Secondary | ICD-10-CM | POA: Diagnosis not present

## 2018-08-26 DIAGNOSIS — Z8249 Family history of ischemic heart disease and other diseases of the circulatory system: Secondary | ICD-10-CM | POA: Insufficient documentation

## 2018-08-26 DIAGNOSIS — Z8261 Family history of arthritis: Secondary | ICD-10-CM | POA: Diagnosis not present

## 2018-08-26 DIAGNOSIS — Z880 Allergy status to penicillin: Secondary | ICD-10-CM | POA: Diagnosis not present

## 2018-08-26 DIAGNOSIS — G43909 Migraine, unspecified, not intractable, without status migrainosus: Secondary | ICD-10-CM | POA: Insufficient documentation

## 2018-08-26 DIAGNOSIS — Z823 Family history of stroke: Secondary | ICD-10-CM | POA: Diagnosis not present

## 2018-08-26 DIAGNOSIS — Z811 Family history of alcohol abuse and dependence: Secondary | ICD-10-CM | POA: Insufficient documentation

## 2018-08-26 DIAGNOSIS — F419 Anxiety disorder, unspecified: Secondary | ICD-10-CM | POA: Diagnosis not present

## 2018-08-26 DIAGNOSIS — E042 Nontoxic multinodular goiter: Secondary | ICD-10-CM | POA: Diagnosis present

## 2018-08-26 DIAGNOSIS — M1990 Unspecified osteoarthritis, unspecified site: Secondary | ICD-10-CM | POA: Insufficient documentation

## 2018-08-26 DIAGNOSIS — Z818 Family history of other mental and behavioral disorders: Secondary | ICD-10-CM | POA: Insufficient documentation

## 2018-08-26 DIAGNOSIS — K649 Unspecified hemorrhoids: Secondary | ICD-10-CM | POA: Diagnosis not present

## 2018-08-26 DIAGNOSIS — Z809 Family history of malignant neoplasm, unspecified: Secondary | ICD-10-CM | POA: Insufficient documentation

## 2018-08-26 DIAGNOSIS — Z8349 Family history of other endocrine, nutritional and metabolic diseases: Secondary | ICD-10-CM | POA: Insufficient documentation

## 2018-08-26 DIAGNOSIS — Z820 Family history of epilepsy and other diseases of the nervous system: Secondary | ICD-10-CM | POA: Diagnosis not present

## 2018-08-26 DIAGNOSIS — E049 Nontoxic goiter, unspecified: Secondary | ICD-10-CM | POA: Diagnosis present

## 2018-08-26 HISTORY — PX: THYROIDECTOMY: SHX17

## 2018-08-26 SURGERY — THYROIDECTOMY
Anesthesia: General | Site: Neck

## 2018-08-26 MED ORDER — PROPOFOL 10 MG/ML IV BOLUS
INTRAVENOUS | Status: AC
  Filled 2018-08-26: qty 20

## 2018-08-26 MED ORDER — ONDANSETRON HCL 4 MG/2ML IJ SOLN
INTRAMUSCULAR | Status: DC | PRN
  Administered 2018-08-26: 4 mg via INTRAVENOUS

## 2018-08-26 MED ORDER — KCL IN DEXTROSE-NACL 20-5-0.45 MEQ/L-%-% IV SOLN
INTRAVENOUS | Status: DC
  Administered 2018-08-26 – 2018-08-27 (×2): via INTRAVENOUS
  Filled 2018-08-26 (×2): qty 1000

## 2018-08-26 MED ORDER — LEVOTHYROXINE SODIUM 88 MCG PO TABS: 88.0000 ug | ORAL_TABLET | Freq: Every day | ORAL | 3 refills | Status: DC

## 2018-08-26 MED ORDER — TRAMADOL HCL 50 MG PO TABS: 50.0000 mg | ORAL_TABLET | Freq: Four times a day (QID) | ORAL | 0 refills | Status: DC | PRN

## 2018-08-26 MED ORDER — SUGAMMADEX SODIUM 200 MG/2ML IV SOLN
INTRAVENOUS | Status: AC
  Filled 2018-08-26: qty 2

## 2018-08-26 MED ORDER — PHENYLEPHRINE HCL 10 MG/ML IJ SOLN
INTRAMUSCULAR | Status: AC
  Filled 2018-08-26: qty 1

## 2018-08-26 MED ORDER — FENTANYL CITRATE (PF) 100 MCG/2ML IJ SOLN
INTRAMUSCULAR | Status: AC
  Administered 2018-08-26: 50 ug via INTRAVENOUS
  Filled 2018-08-26: qty 2

## 2018-08-26 MED ORDER — FENTANYL CITRATE (PF) 100 MCG/2ML IJ SOLN
INTRAMUSCULAR | Status: DC | PRN
  Administered 2018-08-26 (×3): 50 ug via INTRAVENOUS

## 2018-08-26 MED ORDER — ONDANSETRON HCL 4 MG/2ML IJ SOLN
4.0000 mg | Freq: Four times a day (QID) | INTRAMUSCULAR | Status: DC | PRN
  Administered 2018-08-26 – 2018-08-27 (×2): 4 mg via INTRAVENOUS
  Filled 2018-08-26 (×2): qty 2

## 2018-08-26 MED ORDER — ROCURONIUM BROMIDE 50 MG/5ML IV SOSY
PREFILLED_SYRINGE | INTRAVENOUS | Status: DC | PRN
  Administered 2018-08-26: 40 mg via INTRAVENOUS

## 2018-08-26 MED ORDER — 0.9 % SODIUM CHLORIDE (POUR BTL) OPTIME
TOPICAL | Status: DC | PRN
  Administered 2018-08-26: 1000 mL

## 2018-08-26 MED ORDER — SUCCINYLCHOLINE CHLORIDE 200 MG/10ML IV SOSY
PREFILLED_SYRINGE | INTRAVENOUS | Status: DC | PRN
  Administered 2018-08-26: 120 mg via INTRAVENOUS

## 2018-08-26 MED ORDER — PHENYLEPHRINE 40 MCG/ML (10ML) SYRINGE FOR IV PUSH (FOR BLOOD PRESSURE SUPPORT)
PREFILLED_SYRINGE | INTRAVENOUS | Status: DC | PRN
  Administered 2018-08-26 (×2): 40 ug via INTRAVENOUS
  Administered 2018-08-26: 80 ug via INTRAVENOUS
  Administered 2018-08-26 (×2): 40 ug via INTRAVENOUS
  Administered 2018-08-26: 80 ug via INTRAVENOUS

## 2018-08-26 MED ORDER — HYDROCODONE-ACETAMINOPHEN 5-325 MG PO TABS: 1.0000 | ORAL_TABLET | ORAL | Status: DC | PRN

## 2018-08-26 MED ORDER — LIDOCAINE 2% (20 MG/ML) 5 ML SYRINGE
INTRAMUSCULAR | Status: AC
  Filled 2018-08-26: qty 5

## 2018-08-26 MED ORDER — ACETAMINOPHEN 325 MG PO TABS: 650.0000 mg | ORAL_TABLET | Freq: Four times a day (QID) | ORAL | Status: DC | PRN

## 2018-08-26 MED ORDER — MIDAZOLAM HCL 2 MG/2ML IJ SOLN
INTRAMUSCULAR | Status: AC
  Filled 2018-08-26: qty 2

## 2018-08-26 MED ORDER — DEXAMETHASONE SODIUM PHOSPHATE 10 MG/ML IJ SOLN
INTRAMUSCULAR | Status: DC | PRN
  Administered 2018-08-26: 10 mg via INTRAVENOUS

## 2018-08-26 MED ORDER — LIDOCAINE HCL 2 % IJ SOLN
INTRAMUSCULAR | Status: AC
  Filled 2018-08-26: qty 20

## 2018-08-26 MED ORDER — EPHEDRINE 5 MG/ML INJ
INTRAVENOUS | Status: AC
  Filled 2018-08-26: qty 10

## 2018-08-26 MED ORDER — EPHEDRINE SULFATE-NACL 50-0.9 MG/10ML-% IV SOSY
PREFILLED_SYRINGE | INTRAVENOUS | Status: DC | PRN
  Administered 2018-08-26: 10 mg via INTRAVENOUS
  Administered 2018-08-26: 5 mg via INTRAVENOUS

## 2018-08-26 MED ORDER — CIPROFLOXACIN IN D5W 400 MG/200ML IV SOLN
400.0000 mg | INTRAVENOUS | Status: AC
  Administered 2018-08-26: 400 mg via INTRAVENOUS

## 2018-08-26 MED ORDER — SUGAMMADEX SODIUM 200 MG/2ML IV SOLN
INTRAVENOUS | Status: DC | PRN
  Administered 2018-08-26: 200 mg via INTRAVENOUS

## 2018-08-26 MED ORDER — LIP MEDEX EX OINT
TOPICAL_OINTMENT | CUTANEOUS | Status: AC
  Administered 2018-08-26
  Filled 2018-08-26: qty 7

## 2018-08-26 MED ORDER — CHLORHEXIDINE GLUCONATE CLOTH 2 % EX PADS: 6.0000 | MEDICATED_PAD | Freq: Once | CUTANEOUS | Status: DC

## 2018-08-26 MED ORDER — FENTANYL CITRATE (PF) 250 MCG/5ML IJ SOLN
INTRAMUSCULAR | Status: AC
  Filled 2018-08-26: qty 5

## 2018-08-26 MED ORDER — PHENYLEPHRINE 40 MCG/ML (10ML) SYRINGE FOR IV PUSH (FOR BLOOD PRESSURE SUPPORT)
PREFILLED_SYRINGE | INTRAVENOUS | Status: AC
  Filled 2018-08-26: qty 10

## 2018-08-26 MED ORDER — SUCCINYLCHOLINE CHLORIDE 200 MG/10ML IV SOSY
PREFILLED_SYRINGE | INTRAVENOUS | Status: AC
  Filled 2018-08-26: qty 10

## 2018-08-26 MED ORDER — CALCIUM CARBONATE 1250 (500 CA) MG PO TABS
2.0000 | ORAL_TABLET | Freq: Three times a day (TID) | ORAL | Status: DC
  Administered 2018-08-27 (×2): 1000 mg via ORAL
  Filled 2018-08-26 (×2): qty 1

## 2018-08-26 MED ORDER — PROPOFOL 10 MG/ML IV BOLUS
INTRAVENOUS | Status: DC | PRN
  Administered 2018-08-26: 150 mg via INTRAVENOUS

## 2018-08-26 MED ORDER — ACETAMINOPHEN 650 MG RE SUPP: 650.0000 mg | Freq: Four times a day (QID) | RECTAL | Status: DC | PRN

## 2018-08-26 MED ORDER — LIDOCAINE 2% (20 MG/ML) 5 ML SYRINGE
INTRAMUSCULAR | Status: DC | PRN
  Administered 2018-08-26: 60 mg via INTRAVENOUS

## 2018-08-26 MED ORDER — CALCIUM CARBONATE ANTACID 500 MG PO CHEW: 2.0000 | CHEWABLE_TABLET | Freq: Three times a day (TID) | ORAL | 1 refills | Status: DC

## 2018-08-26 MED ORDER — CIPROFLOXACIN IN D5W 400 MG/200ML IV SOLN
INTRAVENOUS | Status: AC
  Filled 2018-08-26: qty 200

## 2018-08-26 MED ORDER — ROCURONIUM BROMIDE 10 MG/ML (PF) SYRINGE
PREFILLED_SYRINGE | INTRAVENOUS | Status: AC
  Filled 2018-08-26: qty 10

## 2018-08-26 MED ORDER — ONDANSETRON HCL 4 MG/2ML IJ SOLN
INTRAMUSCULAR | Status: AC
  Filled 2018-08-26: qty 2

## 2018-08-26 MED ORDER — ONDANSETRON 4 MG PO TBDP: 4.0000 mg | ORAL_TABLET | Freq: Four times a day (QID) | ORAL | Status: DC | PRN

## 2018-08-26 MED ORDER — DEXAMETHASONE SODIUM PHOSPHATE 10 MG/ML IJ SOLN
INTRAMUSCULAR | Status: AC
  Filled 2018-08-26: qty 1

## 2018-08-26 MED ORDER — TRAMADOL HCL 50 MG PO TABS
50.0000 mg | ORAL_TABLET | Freq: Four times a day (QID) | ORAL | Status: DC | PRN
  Administered 2018-08-26 – 2018-08-27 (×2): 50 mg via ORAL
  Filled 2018-08-26 (×2): qty 1

## 2018-08-26 MED ORDER — HYDROMORPHONE HCL 1 MG/ML IJ SOLN
1.0000 mg | INTRAMUSCULAR | Status: DC | PRN
  Administered 2018-08-26 (×3): 1 mg via INTRAVENOUS
  Filled 2018-08-26 (×3): qty 1

## 2018-08-26 MED ORDER — SODIUM CHLORIDE 0.9 % IV SOLN
INTRAVENOUS | Status: DC | PRN
  Administered 2018-08-26: 25 ug/min via INTRAVENOUS

## 2018-08-26 MED ORDER — KETAMINE HCL 10 MG/ML IJ SOLN
INTRAMUSCULAR | Status: AC
  Filled 2018-08-26: qty 1

## 2018-08-26 MED ORDER — LACTATED RINGERS IV SOLN
INTRAVENOUS | Status: DC
  Administered 2018-08-26: 1000 mL via INTRAVENOUS
  Administered 2018-08-26: 09:00:00 via INTRAVENOUS

## 2018-08-26 MED ORDER — FENTANYL CITRATE (PF) 100 MCG/2ML IJ SOLN
25.0000 ug | INTRAMUSCULAR | Status: DC | PRN
  Administered 2018-08-26: 50 ug via INTRAVENOUS

## 2018-08-26 MED ORDER — MIDAZOLAM HCL 5 MG/5ML IJ SOLN
INTRAMUSCULAR | Status: DC | PRN
  Administered 2018-08-26: 2 mg via INTRAVENOUS

## 2018-08-26 SURGICAL SUPPLY — 36 items
ATTRACTOMAT 16X20 MAGNETIC DRP (DRAPES) ×2 IMPLANT
BLADE SURG 15 STRL LF DISP TIS (BLADE) ×1 IMPLANT
BLADE SURG 15 STRL SS (BLADE) ×1
CHLORAPREP W/TINT 26ML (MISCELLANEOUS) ×4 IMPLANT
CLIP VESOCCLUDE MED 6/CT (CLIP) ×4 IMPLANT
CLIP VESOCCLUDE SM WIDE 6/CT (CLIP) ×4 IMPLANT
COVER SURGICAL LIGHT HANDLE (MISCELLANEOUS) ×2 IMPLANT
COVER WAND RF STERILE (DRAPES) ×2 IMPLANT
DERMABOND ADVANCED (GAUZE/BANDAGES/DRESSINGS) ×1
DERMABOND ADVANCED .7 DNX12 (GAUZE/BANDAGES/DRESSINGS) ×1 IMPLANT
DISSECTOR ROUND CHERRY 3/8 STR (MISCELLANEOUS) IMPLANT
DRAPE LAPAROTOMY T 98X78 PEDS (DRAPES) ×2 IMPLANT
ELECT PENCIL ROCKER SW 15FT (MISCELLANEOUS) ×2 IMPLANT
ELECT REM PT RETURN 15FT ADLT (MISCELLANEOUS) ×2 IMPLANT
GAUZE 4X4 16PLY RFD (DISPOSABLE) ×2 IMPLANT
GAUZE SPONGE 4X4 12PLY STRL (GAUZE/BANDAGES/DRESSINGS) IMPLANT
GLOVE SURG ORTHO 8.0 STRL STRW (GLOVE) ×2 IMPLANT
GOWN STRL REUS W/TWL XL LVL3 (GOWN DISPOSABLE) ×4 IMPLANT
HEMOSTAT SURGICEL 2X4 FIBR (HEMOSTASIS) ×2 IMPLANT
ILLUMINATOR WAVEGUIDE N/F (MISCELLANEOUS) ×2 IMPLANT
KIT BASIN OR (CUSTOM PROCEDURE TRAY) ×2 IMPLANT
LIGHT WAVEGUIDE WIDE FLAT (MISCELLANEOUS) IMPLANT
PACK BASIC VI WITH GOWN DISP (CUSTOM PROCEDURE TRAY) ×2 IMPLANT
POWDER SURGICEL 3.0 GRAM (HEMOSTASIS) IMPLANT
SHEARS HARMONIC 9CM CVD (BLADE) ×2 IMPLANT
STRIP CLOSURE SKIN 1/2X4 (GAUZE/BANDAGES/DRESSINGS) IMPLANT
SUT MNCRL AB 4-0 PS2 18 (SUTURE) ×2 IMPLANT
SUT SILK 2 0 (SUTURE)
SUT SILK 2-0 18XBRD TIE 12 (SUTURE) IMPLANT
SUT SILK 3 0 (SUTURE)
SUT SILK 3-0 18XBRD TIE 12 (SUTURE) IMPLANT
SUT VIC AB 3-0 SH 18 (SUTURE) ×4 IMPLANT
SYR BULB IRRIGATION 50ML (SYRINGE) ×2 IMPLANT
TOWEL OR 17X26 10 PK STRL BLUE (TOWEL DISPOSABLE) ×2 IMPLANT
TOWEL OR NON WOVEN STRL DISP B (DISPOSABLE) ×2 IMPLANT
YANKAUER SUCT BULB TIP 10FT TU (MISCELLANEOUS) ×2 IMPLANT

## 2018-08-26 NOTE — Anesthesia Procedure Notes (Signed)
Procedure Name: Intubation Date/Time: 08/26/2018 7:41 AM Performed by: Maxwell Caul, CRNA Pre-anesthesia Checklist: Patient identified, Emergency Drugs available, Suction available and Patient being monitored Patient Re-evaluated:Patient Re-evaluated prior to induction Oxygen Delivery Method: Circle system utilized Preoxygenation: Pre-oxygenation with 100% oxygen Induction Type: IV induction Ventilation: Mask ventilation without difficulty Laryngoscope Size: Mac and 4 Grade View: Grade I Tube type: Oral Tube size: 7.0 mm Number of attempts: 1 Airway Equipment and Method: Stylet Placement Confirmation: ETT inserted through vocal cords under direct vision,  positive ETCO2 and breath sounds checked- equal and bilateral Secured at: 21 cm Tube secured with: Tape Dental Injury: Teeth and Oropharynx as per pre-operative assessment

## 2018-08-26 NOTE — Anesthesia Postprocedure Evaluation (Signed)
Anesthesia Post Note  Patient: Angela Patrick  Procedure(s) Performed: TOTAL THYROIDECTOMY (N/A Neck)     Patient location during evaluation: PACU Anesthesia Type: General Level of consciousness: awake Pain management: pain level controlled Vital Signs Assessment: post-procedure vital signs reviewed and stable Respiratory status: spontaneous breathing Cardiovascular status: stable Anesthetic complications: no    Last Vitals:  Vitals:   08/26/18 0930 08/26/18 0945  BP: (!) 153/90 (!) 140/91  Pulse: 77 80  Resp: 13 16  Temp:    SpO2: 100% 100%    Last Pain:  Vitals:   08/26/18 0945  TempSrc:   PainSc: 7                  Brigette Hopfer

## 2018-08-26 NOTE — Anesthesia Preprocedure Evaluation (Addendum)
Anesthesia Evaluation  Patient identified by MRN, date of birth, ID band Patient awake    Reviewed: Allergy & Precautions, NPO status , Patient's Chart, lab work & pertinent test results  History of Anesthesia Complications (+) PSEUDOCHOLINESTERASE DEFICIENCY  Airway Mallampati: II  TM Distance: >3 FB     Dental   Pulmonary neg pulmonary ROS,    breath sounds clear to auscultation       Cardiovascular negative cardio ROS   Rhythm:Regular Rate:Normal     Neuro/Psych    GI/Hepatic negative GI ROS, Neg liver ROS,   Endo/Other  negative endocrine ROS  Renal/GU negative Renal ROS     Musculoskeletal  (+) Arthritis ,   Abdominal   Peds  Hematology   Anesthesia Other Findings   Reproductive/Obstetrics                            Anesthesia Physical Anesthesia Plan  ASA: II  Anesthesia Plan: General   Post-op Pain Management:    Induction: Intravenous  PONV Risk Score and Plan: Ondansetron, Dexamethasone and Midazolam  Airway Management Planned: Oral ETT  Additional Equipment:   Intra-op Plan:   Post-operative Plan:   Informed Consent: I have reviewed the patients History and Physical, chart, labs and discussed the procedure including the risks, benefits and alternatives for the proposed anesthesia with the patient or authorized representative who has indicated his/her understanding and acceptance.   Dental advisory given  Plan Discussed with: Anesthesiologist and CRNA  Anesthesia Plan Comments:        Anesthesia Quick Evaluation

## 2018-08-26 NOTE — Interval H&P Note (Signed)
History and Physical Interval Note:  08/26/2018 7:12 AM  Angela Patrick  has presented today for surgery, with the diagnosis of multinodular thyroid goiter.  The various methods of treatment have been discussed with the patient and family. After consideration of risks, benefits and other options for treatment, the patient has consented to    Procedure(s): TOTAL THYROIDECTOMY (N/A) as a surgical intervention .    The patient's history has been reviewed, patient examined, no change in status, stable for surgery.  I have reviewed the patient's chart and labs.  Questions were answered to the patient's satisfaction.    Darnell Levelodd Corry Storie, MD [facility] Office: [phone]    [name] MJudie Petit

## 2018-08-26 NOTE — Anesthesia Postprocedure Evaluation (Signed)
Anesthesia Post Note  Patient: Angela Patrick  Procedure(s) Performed: TOTAL THYROIDECTOMY (N/A Neck)     Patient location during evaluation: PACU Anesthesia Type: General Level of consciousness: awake Pain management: pain level controlled Vital Signs Assessment: post-procedure vital signs reviewed and stable Respiratory status: spontaneous breathing Cardiovascular status: stable Postop Assessment: no apparent nausea or vomiting Anesthetic complications: no    Last Vitals:  Vitals:   08/26/18 1015 08/26/18 1147  BP: (!) 136/93 119/77  Pulse: 80 78  Resp: 15 15  Temp: 36.9 C 36.6 C  SpO2: 100% 100%    Last Pain:  Vitals:   08/26/18 1147  TempSrc: Oral  PainSc:                  Sharunda Salmon

## 2018-08-26 NOTE — Op Note (Signed)
Procedure Note  Pre-operative Diagnosis:  Multinodular thyroid goiter with compressive symptoms  Post-operative Diagnosis:  same  Surgeon:  Darnell Levelodd Daylen Hack, MD  Assistant:  none   Procedure:  Total thyroidectomy  Anesthesia:  General  Estimated Blood Loss:  minimal  Drains: none         Specimen: thyroid to pathology  Indications:  Patient is referred by Dr. Ronnette HilaAvernini for surgical evaluation and management of multinodular thyroid goiter with compressive symptoms. Patient's primary care physician is Dr. Leodis SiasFrancis Wong. Patient has a history of long-standing multinodular thyroid goiter dating back to 2014. She was diagnosed [place] in [place] She underwent fine-needle aspiration biopsy and brought along with pathology report showing that this was benign. Patient has never been on thyroid medication. She has had no prior head or neck surgery. Thyroid has continued to enlarge. Most recent thyroid ultrasound performed in July 2019 shows an enlarged right thyroid lobe measuring 6.1 cm and an enlarged left thyroid lobe measuring 9.0 cm. There are multiple nodules but none of which were suspicious and require biopsy. Most recent TSH level is normal at 0.97. I describes compressive symptoms including nighttime shortness of breath and choking, globus sensation, chronic hoarseness, left ear pain, and solid food dysphagia. There is a family history of thyroid disease in the patient's sister who required treatment with radioactive iodine and the patient's mother who developed thyroid cancer. Patient presents today to discuss total thyroidectomy for management of multinodular thyroid goiter with compressive symptoms.  Procedure Details: [facility]  The patient was brought to the operating room and placed in a supine position on the operating room table.  Following administration of general anesthesia, the patient was positioned and then  prepped and draped in the usual aseptic fashion.  After ascertaining that an adequate level of anesthesia had been achieved, a Kocher incision was made with #15 blade.  Dissection was carried through subcutaneous tissues and platysma. Hemostasis was achieved with the electrocautery.  Skin flaps were elevated cephalad and caudad from the thyroid notch to the sternal notch.  The Mahorner self-retaining retractor was placed for exposure.  Strap muscles were incised in the midline and dissection was begun on the left side.  Strap muscles were reflected laterally.  Left thyroid lobe was markedly enlarged and multinodular.  The left lobe was gently mobilized with blunt dissection.  Superior pole vessels were dissected out and divided individually between small and medium Ligaclips with the Harmonic scalpel.  The thyroid lobe was rolled anteriorly.  Branches of the inferior thyroid artery were divided between small Ligaclips with the Harmonic scalpel.  Inferior venous tributaries were divided between Ligaclips.  Both the superior and inferior parathyroid glands were identified and preserved on their vascular pedicles.  The recurrent laryngeal nerve was identified and preserved along its course.  The ligament of Allyson SabalBerry was released with the electrocautery and the gland was mobilized onto the anterior trachea. Isthmus was mobilized across the midline.  There was no significant pyramidal lobe present.  Dry pack was placed in the left neck.  Next, the right thyroid lobe was gently mobilized with blunt dissection.  Right thyroid lobe was normal in size but contained small nodules.  Superior pole vessels were dissected out and divided between small and medium Ligaclips with the Harmonic scalpel.  Superior parathyroid was identified and preserved.  Inferior venous tributaries were divided between medium Ligaclips with the Harmonic scalpel.  The right thyroid lobe was  rolled anteriorly and the branches of the inferior thyroid  artery divided between small Ligaclips.  The right recurrent laryngeal nerve was identified and preserved along its course.  It was dissected off of the lateral aspect of the lobe and completely mobilized. The ligament of Allyson Sabal was released with the electrocautery.  The right thyroid lobe was mobilized onto the anterior trachea and the remainder of the thyroid was dissected off the anterior trachea and the thyroid was completely excised.  A suture was used to mark the left lobe. The entire thyroid gland was submitted to pathology for review.  The neck was irrigated with warm saline.  Fibrillar was placed throughout the operative field.  Strap muscles were reapproximated in the midline with interrupted 3-0 Vicryl sutures.  Platysma was closed with interrupted 3-0 Vicryl sutures.  Skin was closed with a running 4-0 Monocryl subcuticular suture.  Wound was washed and dried and Dermabond was applied. The patient was awakened from anesthesia and brought to the recovery room.  The patient tolerated the procedure well.   Darnell Level, MD [facility] Office: [phone]

## 2018-08-26 NOTE — Discharge Instructions (Signed)
[facility]  THYROID & PARATHYROID SURGERY:  POST-OP INSTRUCTIONS  Always review your discharge instruction sheet from the facility where your surgery was performed.  A prescription for pain medication may be given to you upon discharge.  Take your pain medication as prescribed.  If narcotic pain medicine is not needed, then you may take acetaminophen (Tylenol) or ibuprofen (Advil) as needed.  Take your usually prescribed medications unless otherwise directed.  If you need a refill on your pain medication, please contact our office during regular business hours.  Prescriptions cannot be processed by our office after 5 pm or on weekends.  Start with a light diet upon arrival home, such as soup and crackers or toast.  Be sure to drink plenty of fluids daily.  Resume your normal diet the day after surgery.  Most patients will experience some swelling and bruising on the chest and neck area.  Ice packs will help.  Swelling and bruising can take several days to resolve.   It is common to experience some constipation after surgery.  Increasing fluid intake and taking a stool softener (Colace) will usually help or prevent this problem.  A mild laxative (Milk of Magnesia or Miralax) should be taken according to package directions if there has been no bowel movement after 48 hours.  You have steri-strips and a gauze dressing over your incision.  You may remove the gauze bandage on the second day after surgery, and you may shower at that time.  Leave your steri-strips (small skin tapes) in place directly over the incision.  These strips should remain on the skin for 5-7 days and then be removed.  You may get them wet in the shower and pat them dry.  You may resume regular (light) daily activities beginning the next day (such as daily self-care, walking, climbing stairs) gradually increasing activities as tolerated.  You may have sexual intercourse when it is comfortable.  Refrain from  any heavy lifting or straining until approved by your doctor.  You may drive when you no longer are taking prescription pain medication, you can comfortably wear a seatbelt, and you can safely maneuver your car and apply brakes.  You should see your doctor in the office for a follow-up appointment approximately three weeks after your surgery.  Make sure that you call for this appointment within a day or two after you arrive home to insure a convenient appointment time.  WHEN TO CALL YOUR DOCTOR: -- Fever greater than 101.5 -- Inability to urinate -- Nausea and/or vomiting - persistent -- Extreme swelling or bruising -- Continued bleeding from incision -- Increased pain, redness, or drainage from the incision -- Difficulty swallowing or breathing -- Muscle cramping or spasms -- Numbness or tingling in hands or around lips  The clinic staff is available to answer your questions during regular business hours.  Please don't hesitate to call and ask to speak to one of the nurses if you have concerns.  Jazmarie Biever, MD [facility] Office: [phone] 

## 2018-08-26 NOTE — Transfer of Care (Signed)
Immediate Anesthesia Transfer of Care Note  Patient: Angela Patrick  Procedure(s) Performed: TOTAL THYROIDECTOMY (N/A Neck)  Patient Location: PACU  Anesthesia Type:General  Level of Consciousness: awake, alert  and oriented  Airway & Oxygen Therapy: Patient Spontanous Breathing and Patient connected to face mask oxygen  Post-op Assessment: Report given to RN and Post -op Vital signs reviewed and stable  Post vital signs: Reviewed and stable  Last Vitals:  Vitals Value Taken Time  BP 152/100 08/26/2018  9:24 AM  Temp    Pulse 82 08/26/2018  9:25 AM  Resp 19 08/26/2018  9:25 AM  SpO2 100 % 08/26/2018  9:25 AM  Vitals shown include unvalidated device data.  Last Pain:  Vitals:   08/26/18 0602  TempSrc:   PainSc: 2       Patients Stated Pain Goal: 4 (08/26/18 0602)  Complications: No apparent anesthesia complications

## 2018-08-27 ENCOUNTER — Encounter (HOSPITAL_COMMUNITY): Payer: Self-pay | Admitting: Surgery

## 2018-08-27 DIAGNOSIS — E042 Nontoxic multinodular goiter: Secondary | ICD-10-CM | POA: Diagnosis not present

## 2018-08-27 LAB — BASIC METABOLIC PANEL
ANION GAP: 8 (ref 5–15)
BUN: 11 mg/dL (ref 8–23)
CALCIUM: 8.2 mg/dL — AB (ref 8.9–10.3)
CO2: 24 mmol/L (ref 22–32)
Chloride: 107 mmol/L (ref 98–111)
Creatinine, Ser: 0.82 mg/dL (ref 0.44–1.00)
Glucose, Bld: 109 mg/dL — ABNORMAL HIGH (ref 70–99)
POTASSIUM: 3.7 mmol/L (ref 3.5–5.1)
Sodium: 139 mmol/L (ref 135–145)

## 2018-08-27 MED ORDER — CALCIUM GLUCONATE-NACL 2-0.675 GM/100ML-% IV SOLN
2.0000 g | INTRAVENOUS | Status: AC
  Administered 2018-08-27: 2000 mg via INTRAVENOUS
  Filled 2018-08-27: qty 100

## 2018-08-27 MED ORDER — GUAIFENESIN ER 600 MG PO TB12
600.0000 mg | ORAL_TABLET | Freq: Two times a day (BID) | ORAL | Status: DC
  Administered 2018-08-27: 600 mg via ORAL
  Filled 2018-08-27: qty 1

## 2018-08-27 NOTE — Progress Notes (Signed)
Discharge and medication instructions reviewed with patient. Questions answered and patient denies further questions. One prescription given to patient. Family members are here to drive patient home. Lina SarBeth Nylan Nakatani, RN

## 2018-08-27 NOTE — Discharge Summary (Signed)
[facility] Discharge Summary   Patient ID: Angela KinDelorse Patrick MRN: [id number] DOB/AGE: 66/06/1951 66 y.o.  Admit date: 08/26/2018 Discharge date: 08/27/2018  Admitting Diagnosis: Multinodular goiter  Discharge Diagnosis Patient Active Problem List   Diagnosis Date Noted  . Multinodular goiter (nontoxic) 08/26/2018  . Multiple thyroid nodules 08/23/2018  . 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 Enlarged thyroid 10/22/2015  . Chronic lumbar pain 10/22/2015    Consultants None  Imaging: No results found.  Procedures Dr. Gerrit FriendsGerkin (08/26/18) - Total thyroidectomy  Hospital Course:  Patient is a 67 year old female  who [facility]ide[facility]. Patient was admitted and underwent procedure listed above.  Tolerated procedure well and was transferred to the floor.  Diet was advanced as tolerated.  On POD1, the patient was voiding well, tolerating diet, ambulating well, pain well controlled, vital signs stable, incisions c/d/i and felt stable for discharge home.  Patient will follow up in our office in 3 weeks and knows to call with questions or concerns. She will call to confirm appointment date/time.     Allergies as of 08/27/2018      Reactions   Iodine Swelling   Penicillins Rash   Has patient had a PCN reaction causing immediate rash, facial/tongue/throat swelling, SOB or lightheadedness with hypotension: no Has patient had a PCN reaction causing severe rash involving mucus membranes or skin necrosis: no  Has patient had a PCN reaction that required hospitalization: no Has patient had a PCN reaction occurring within the last 10 years: no If all of the above answers are "NO", then may proceed with Cephalosporin use.      Medication List    STOP taking these medications   vitamin B-12 1000 MCG tablet Commonly known as:  CYANOCOBALAMIN     TAKE these medications   acetaminophen 500 MG tablet Commonly known as:  TYLENOL Take 2 tablets (1,000 mg  total) by mouth every 8 (eight) hours as needed.   Biotin 10 MG Tabs Take 1 tablet by mouth daily.   CALCIUM + D PO Take 1 tablet by mouth daily.   calcium carbonate 500 MG chewable tablet Commonly known as:  TUMS - dosed in mg elemental calcium Chew 2 tablets (400 mg of elemental calcium total) by mouth 3 (three) times daily.   CENTRUM SILVER ADULT 50+ PO Take 1 tablet by mouth every other day.   levothyroxine 88 MCG tablet Commonly known as:  SYNTHROID, LEVOTHROID Take 1 tablet (88 mcg total) by mouth daily before breakfast.   traMADol 50 MG tablet Commonly known as:  ULTRAM Take 1-2 tablets (50-100 mg total) by mouth every 6 (six) hours as needed.   vitamin C 1000 MG tablet Take 1,000 mg by mouth daily.        Follow-up Information    Darnell LevelGerkin, Todd, MD. Schedule an appointment as soon as possible for a visit in 3 weeks.   Specialty:  General Surgery Why:  For wound re-check Contact information: [address] [place] [address] [phone]           Signed: Wells GuilesKelly Rayburn, [facility] 08/27/2018, 12:23 PM Pager: [phone] Mon-Fri 7:00 am-4:30 pm Sat-Sun 7:00 am-11:30 am

## 2018-08-27 NOTE — Progress Notes (Signed)
[facility] Progress Note  1 Day Post-Op  Subjective: CC:  C/o some throat soreness and nausea this AM. Attributes nausea to tramadol/empty stomach. Mobilizing in her room.   Objective: Vital signs in last 24 hours: Temp:  [97.7 F (36.5 C)-98.8 F (37.1 C)] 98.7 F (37.1 C) (11/15 0517) Pulse Rate:  [59-81] 62 (11/15 0517) Resp:  [9-20] 16 (11/15 0517) BP: (98-153)/(71-100) 106/72 (11/15 0517) SpO2:  [97 %-100 %] 100 % (11/15 0517)    Intake/Output from previous day: 11/14 0701 - 11/15 0700 In: 2307.3 [P.O.:360; I.V.:1747.3; IV Piggyback:200] Out: 2320 [Urine:2300; Blood:20] Intake/Output this shift: Total I/O In: -  Out: 300 [Urine:300]  PE: Gen:  Alert, NAD, pleasant HEENT: incision clean and dry, no hematoma or erythema Card:  Regular rate and rhythm Pulm:  Normal effort, clear to auscultation bilaterally Abd: Soft Skin: warm and dry, no rashes  Psych: A&Ox3   Lab Results:  No results for input(s): WBC, HGB, HCT, PLT in the last 72 hours. BMET Recent Labs    08/27/18 0417  NA 139  K 3.7  CL 107  CO2 24  GLUCOSE 109*  BUN 11  CREATININE 0.82  CALCIUM 8.2*   PT/INR No results for input(s): LABPROT, INR in the last 72 hours. CMP     Component Value Date/Time   NA 139 08/27/2018 0417   K 3.7 08/27/2018 0417   CL 107 08/27/2018 0417   CO2 24 08/27/2018 0417   GLUCOSE 109 (H) 08/27/2018 0417   BUN 11 08/27/2018 0417   CREATININE 0.82 08/27/2018 0417   CALCIUM 8.2 (L) 08/27/2018 0417   GFRNONAA >60 08/27/2018 0417   GFRAA >60 08/27/2018 0417   Lipase  No results found for: LIPASE     Studies/Results: No results found.  Anti-infectives: Anti-infectives (From admission, onward)   Start     Dose/Rate Route Frequency Ordered Stop   08/26/18 0600  ciprofloxacin (CIPRO) IVPB 400 mg     400 mg 200 mL/hr over 60 Minutes Intravenous On call to O.R. 08/26/18 0549 08/26/18 0812   08/26/18 0552  ciprofloxacin (CIPRO) 400 MG/200ML IVPB     Note to Pharmacy:  Curlene DolphinWhitlow, Cheryl   : cabinet override      08/26/18 0552 08/26/18 0742       Assessment/Plan POD#1 s/p Total thyroidectomy 11/14 Dr. Gerrit FriendsGerkin - afebrile, VSS  - tolerating liquids - Calcium 8.2, give 2 g calcium gluconate - monitor nausea, can make medication adjustments if necessary - anticipate PM discharge after IV calcium gluconate   LOS: 0 days    Hosie SpangleElizabeth Teal Raben, [facility] Pager: [phone]

## 2018-08-30 NOTE — Progress Notes (Signed)
Please contact patient and notify of benign pathology results.  Jaquelin Meaney M. Manilla Strieter, MD, FACS [facility] Office: [phone]   

## 2018-12-20 ENCOUNTER — Other Ambulatory Visit (HOSPITAL_COMMUNITY): Payer: Self-pay | Admitting: Orthopedic Surgery

## 2019-01-06 ENCOUNTER — Ambulatory Visit (HOSPITAL_BASED_OUTPATIENT_CLINIC_OR_DEPARTMENT_OTHER)
Admission: RE | Admit: 2019-01-06 | Payer: Federal, State, Local not specified - PPO | Source: Home / Self Care | Admitting: Orthopedic Surgery

## 2019-01-06 ENCOUNTER — Encounter (HOSPITAL_BASED_OUTPATIENT_CLINIC_OR_DEPARTMENT_OTHER): Admission: RE | Payer: Self-pay | Source: Home / Self Care

## 2019-01-06 SURGERY — BUNIONECTOMY
Anesthesia: General | Laterality: Right

## 2019-02-21 ENCOUNTER — Other Ambulatory Visit (HOSPITAL_COMMUNITY): Payer: Self-pay | Admitting: Orthopedic Surgery

## 2019-02-24 ENCOUNTER — Other Ambulatory Visit: Payer: Self-pay

## 2019-02-24 ENCOUNTER — Encounter (HOSPITAL_BASED_OUTPATIENT_CLINIC_OR_DEPARTMENT_OTHER): Payer: Self-pay

## 2019-02-25 ENCOUNTER — Encounter (HOSPITAL_BASED_OUTPATIENT_CLINIC_OR_DEPARTMENT_OTHER): Payer: Self-pay

## 2019-02-25 NOTE — Anesthesia Preprocedure Evaluation (Addendum)
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   Reviewed: Allergy & Precautions, NPO status , Patient's Chart, lab work & pertinent test results  History of Anesthesia Complications (+) PSEUDOCHOLINESTERASE DEFICIENCY and history of anesthetic complications  Airway Mallampati: II  TM Distance: >3 FB Neck ROM: Full    Dental no notable dental hx. (+) Teeth Intact   Pulmonary neg pulmonary ROS,    Pulmonary exam normal breath sounds clear to auscultation       Cardiovascular negative cardio ROS Normal cardiovascular exam Rhythm:Regular Rate:Normal     Neuro/Psych negative neurological ROS  negative psych ROS   GI/Hepatic negative GI ROS, Neg liver ROS,   Endo/Other  Hx/o multinodular goiter S/P thyroidectomy on supplemental thyroid Rx  Renal/GU negative Renal ROS  negative genitourinary   Musculoskeletal  (+) Arthritis , Osteoarthritis,  Chronic back pain Left Bunion Left hammertoe Retained hardware left ankle   Abdominal   Peds  Hematology negative hematology ROS (+)   Anesthesia Other Findings   Reproductive/Obstetrics                             Anesthesia Physical Anesthesia Plan  ASA: II  Anesthesia Plan: General   Post-op Pain Management:  Regional for Post-op pain   Induction: Intravenous  PONV Risk Score and Plan: 4 or greater and Ondansetron, Dexamethasone and Treatment may vary due to age or medical condition  Airway Management Planned: LMA and Oral ETT  Additional Equipment:   Intra-op Plan:   Post-operative Plan: Extubation in OR  Informed Consent: I have reviewed the patients History and Physical, chart, labs and discussed the procedure including the risks, benefits and  alternatives for the proposed anesthesia with the patient or authorized representative who has indicated his/her understanding and acceptance.     Dental advisory given  Plan Discussed with: CRNA and Surgeon  Anesthesia Plan Comments: (ANESTHESIA CONSULT 02/25/2019. Patient with history of pseudocholinesterase deficiency by chart review of last anesthesia pre-op 08/2018.  Patient states she had an issue with palpitations during a dental procedure after being injected with local anesthesia which required prolonged monitoring at dental office and supplemental O2.  In November, had thyroidectomy and patient states she was difficult to arouse in PACU.  Chart review did not reveal any complications post-operatively.  Since thyroid surgery, patient states her throat feels tight in AM, but denies difficulty swallowing or breathing.  Discussed anesthesia plan for upcoming orthopedic surgery including likely nerve block for bunionectomy.  Patient questions answered.  Physical exam revealed no obvious airway abnormalities.  Patient will be re-evaluated on day of surgery.  Rondall Allegra, MD)       Anesthesia Quick Evaluation

## 2019-02-25 NOTE — Progress Notes (Signed)
Ensure pre surgical drink given to pt with instructions, pt verbalized understanding

## 2019-02-28 ENCOUNTER — Other Ambulatory Visit (HOSPITAL_COMMUNITY)
Admission: RE | Admit: 2019-02-28 | Discharge: 2019-02-28 | Disposition: A | Discharge status: Home / Self Care | Payer: Federal, State, Local not specified - PPO | Source: Ambulatory Visit | Attending: Orthopedic Surgery | Admitting: Orthopedic Surgery

## 2019-02-28 DIAGNOSIS — Z01812 Encounter for preprocedural laboratory examination: Secondary | ICD-10-CM | POA: Diagnosis present

## 2019-02-28 DIAGNOSIS — Z1159 Encounter for screening for other viral diseases: Secondary | ICD-10-CM | POA: Diagnosis not present

## 2019-03-01 LAB — NOVEL CORONAVIRUS, NAA (HOSP ORDER, SEND-OUT TO REF LAB; TAT 18-24 HRS): SARS-CoV-2, NAA: NOT DETECTED

## 2019-03-03 ENCOUNTER — Ambulatory Visit (HOSPITAL_BASED_OUTPATIENT_CLINIC_OR_DEPARTMENT_OTHER): Payer: Federal, State, Local not specified - PPO | Admitting: Anesthesiology

## 2019-03-03 ENCOUNTER — Encounter (HOSPITAL_BASED_OUTPATIENT_CLINIC_OR_DEPARTMENT_OTHER): Payer: Self-pay | Admitting: Anesthesiology

## 2019-03-03 ENCOUNTER — Encounter (HOSPITAL_BASED_OUTPATIENT_CLINIC_OR_DEPARTMENT_OTHER)
Admission: RE | Disposition: A | Discharge status: Home / Self Care | Payer: Self-pay | Source: Home / Self Care | Attending: Orthopedic Surgery

## 2019-03-03 ENCOUNTER — Ambulatory Visit (HOSPITAL_BASED_OUTPATIENT_CLINIC_OR_DEPARTMENT_OTHER)
Admission: RE | Admit: 2019-03-03 | Discharge: 2019-03-03 | Disposition: A | Discharge status: Home / Self Care | Payer: Federal, State, Local not specified - PPO | Attending: Orthopedic Surgery | Admitting: Orthopedic Surgery

## 2019-03-03 DIAGNOSIS — Z79899 Other long term (current) drug therapy: Secondary | ICD-10-CM | POA: Diagnosis not present

## 2019-03-03 DIAGNOSIS — L905 Scar conditions and fibrosis of skin: Secondary | ICD-10-CM | POA: Diagnosis not present

## 2019-03-03 DIAGNOSIS — Z79890 Hormone replacement therapy (postmenopausal): Secondary | ICD-10-CM | POA: Insufficient documentation

## 2019-03-03 DIAGNOSIS — M2041 Other hammer toe(s) (acquired), right foot: Secondary | ICD-10-CM | POA: Insufficient documentation

## 2019-03-03 DIAGNOSIS — M7742 Metatarsalgia, left foot: Secondary | ICD-10-CM | POA: Insufficient documentation

## 2019-03-03 DIAGNOSIS — Y831 Surgical operation with implant of artificial internal device as the cause of abnormal reaction of the patient, or of later complication, without mention of misadventure at the time of the procedure: Secondary | ICD-10-CM | POA: Insufficient documentation

## 2019-03-03 DIAGNOSIS — T8484XA Pain due to internal orthopedic prosthetic devices, implants and grafts, initial encounter: Secondary | ICD-10-CM | POA: Insufficient documentation

## 2019-03-03 DIAGNOSIS — Z888 Allergy status to other drugs, medicaments and biological substances status: Secondary | ICD-10-CM | POA: Diagnosis not present

## 2019-03-03 DIAGNOSIS — Z880 Allergy status to penicillin: Secondary | ICD-10-CM | POA: Insufficient documentation

## 2019-03-03 DIAGNOSIS — E049 Nontoxic goiter, unspecified: Secondary | ICD-10-CM | POA: Diagnosis not present

## 2019-03-03 DIAGNOSIS — M1990 Unspecified osteoarthritis, unspecified site: Secondary | ICD-10-CM | POA: Diagnosis not present

## 2019-03-03 DIAGNOSIS — M21612 Bunion of left foot: Secondary | ICD-10-CM | POA: Diagnosis present

## 2019-03-03 HISTORY — PX: BUNIONECTOMY WITH HAMMERTOE RECONSTRUCTION: SHX5600

## 2019-03-03 HISTORY — PX: HARDWARE REMOVAL: SHX979

## 2019-03-03 SURGERY — BUNIONECTOMY, WITH HAMMER TOE CORRECTION
Anesthesia: General | Site: Foot | Laterality: Right

## 2019-03-03 MED ORDER — FENTANYL CITRATE (PF) 100 MCG/2ML IJ SOLN
50.0000 ug | INTRAMUSCULAR | Status: AC | PRN
  Administered 2019-03-03: 25 ug via INTRAVENOUS
  Administered 2019-03-03 (×2): 50 ug via INTRAVENOUS
  Administered 2019-03-03: 25 ug via INTRAVENOUS
  Administered 2019-03-03 (×2): 50 ug via INTRAVENOUS

## 2019-03-03 MED ORDER — SODIUM CHLORIDE 0.9 % IV SOLN: INTRAVENOUS | Status: DC

## 2019-03-03 MED ORDER — OXYCODONE HCL 5 MG PO TABS
ORAL_TABLET | ORAL | Status: AC
  Filled 2019-03-03: qty 1

## 2019-03-03 MED ORDER — MIDAZOLAM HCL 2 MG/2ML IJ SOLN: 1.0000 mg | INTRAMUSCULAR | Status: DC | PRN

## 2019-03-03 MED ORDER — OXYCODONE HCL 5 MG PO TABS: 5.0000 mg | ORAL_TABLET | Freq: Four times a day (QID) | ORAL | 0 refills | Status: AC | PRN

## 2019-03-03 MED ORDER — OXYCODONE HCL 5 MG/5ML PO SOLN: 5.0000 mg | Freq: Once | ORAL | Status: AC | PRN

## 2019-03-03 MED ORDER — FENTANYL CITRATE (PF) 100 MCG/2ML IJ SOLN
INTRAMUSCULAR | Status: AC
  Filled 2019-03-03: qty 2

## 2019-03-03 MED ORDER — PROPOFOL 10 MG/ML IV BOLUS
INTRAVENOUS | Status: DC | PRN
  Administered 2019-03-03: 150 mg via INTRAVENOUS

## 2019-03-03 MED ORDER — FENTANYL CITRATE (PF) 100 MCG/2ML IJ SOLN
25.0000 ug | INTRAMUSCULAR | Status: DC | PRN
  Administered 2019-03-03 (×2): 50 ug via INTRAVENOUS

## 2019-03-03 MED ORDER — PROPOFOL 500 MG/50ML IV EMUL
INTRAVENOUS | Status: DC | PRN
  Administered 2019-03-03: 25 ug/kg/min via INTRAVENOUS

## 2019-03-03 MED ORDER — ONDANSETRON HCL 4 MG/2ML IJ SOLN
INTRAMUSCULAR | Status: DC | PRN
  Administered 2019-03-03: 4 mg via INTRAVENOUS

## 2019-03-03 MED ORDER — LACTATED RINGERS IV SOLN
INTRAVENOUS | Status: DC
  Administered 2019-03-03 (×2): via INTRAVENOUS

## 2019-03-03 MED ORDER — LIDOCAINE HCL (CARDIAC) PF 100 MG/5ML IV SOSY
PREFILLED_SYRINGE | INTRAVENOUS | Status: DC | PRN
  Administered 2019-03-03: 60 mg via INTRAVENOUS

## 2019-03-03 MED ORDER — CEFAZOLIN SODIUM-DEXTROSE 2-4 GM/100ML-% IV SOLN
INTRAVENOUS | Status: AC
  Filled 2019-03-03: qty 100

## 2019-03-03 MED ORDER — MEPERIDINE HCL 25 MG/ML IJ SOLN: 6.2500 mg | INTRAMUSCULAR | Status: DC | PRN

## 2019-03-03 MED ORDER — OXYCODONE HCL 5 MG PO TABS
5.0000 mg | ORAL_TABLET | Freq: Once | ORAL | Status: AC | PRN
  Administered 2019-03-03: 5 mg via ORAL

## 2019-03-03 MED ORDER — CEFAZOLIN SODIUM-DEXTROSE 2-4 GM/100ML-% IV SOLN
2.0000 g | INTRAVENOUS | Status: AC
  Administered 2019-03-03: 2 g via INTRAVENOUS

## 2019-03-03 MED ORDER — ONDANSETRON HCL 4 MG/2ML IJ SOLN: 4.0000 mg | Freq: Once | INTRAMUSCULAR | Status: DC | PRN

## 2019-03-03 MED ORDER — DEXAMETHASONE SODIUM PHOSPHATE 10 MG/ML IJ SOLN
INTRAMUSCULAR | Status: DC | PRN
  Administered 2019-03-03: 10 mg via INTRAVENOUS

## 2019-03-03 MED ORDER — BUPIVACAINE-EPINEPHRINE (PF) 0.5% -1:200000 IJ SOLN
INTRAMUSCULAR | Status: AC
  Filled 2019-03-03: qty 30

## 2019-03-03 MED ORDER — 0.9 % SODIUM CHLORIDE (POUR BTL) OPTIME
TOPICAL | Status: DC | PRN
  Administered 2019-03-03: 200 mL

## 2019-03-03 MED ORDER — CHLORHEXIDINE GLUCONATE 4 % EX LIQD: 60.0000 mL | Freq: Once | CUTANEOUS | Status: DC

## 2019-03-03 MED ORDER — SCOPOLAMINE 1 MG/3DAYS TD PT72: 1.0000 | MEDICATED_PATCH | Freq: Once | TRANSDERMAL | Status: DC | PRN

## 2019-03-03 MED ORDER — BUPIVACAINE-EPINEPHRINE 0.5% -1:200000 IJ SOLN
INTRAMUSCULAR | Status: DC | PRN
  Administered 2019-03-03: 40 mL

## 2019-03-03 MED ORDER — MIDAZOLAM HCL 2 MG/2ML IJ SOLN
INTRAMUSCULAR | Status: AC
  Filled 2019-03-03: qty 2

## 2019-03-03 SURGICAL SUPPLY — 81 items
BANDAGE ACE 4X5 VEL STRL LF (GAUZE/BANDAGES/DRESSINGS) ×3 IMPLANT
BANDAGE ESMARK 6X9 LF (GAUZE/BANDAGES/DRESSINGS) ×2 IMPLANT
BENZOIN TINCTURE PRP APPL 2/3 (GAUZE/BANDAGES/DRESSINGS) IMPLANT
BIT DRILL 1.5X85 (BIT) ×3 IMPLANT
BLADE AVERAGE 25X9 (BLADE) IMPLANT
BLADE LONG MED 25X9 (BLADE) ×3 IMPLANT
BLADE MICRO SAGITTAL (BLADE) IMPLANT
BLADE MINI RND TIP GREEN BEAV (BLADE) IMPLANT
BLADE OSC/SAG .038X5.5 CUT EDG (BLADE) ×3 IMPLANT
BLADE SURG 15 STRL LF DISP TIS (BLADE) ×8 IMPLANT
BLADE SURG 15 STRL SS (BLADE) ×4
BNDG COHESIVE 4X5 TAN STRL (GAUZE/BANDAGES/DRESSINGS) ×3 IMPLANT
BNDG COHESIVE 6X5 TAN STRL LF (GAUZE/BANDAGES/DRESSINGS) IMPLANT
BNDG CONFORM 3 STRL LF (GAUZE/BANDAGES/DRESSINGS) ×3 IMPLANT
BNDG ESMARK 4X9 LF (GAUZE/BANDAGES/DRESSINGS) IMPLANT
BNDG ESMARK 6X9 LF (GAUZE/BANDAGES/DRESSINGS) ×3
CHLORAPREP W/TINT 26 (MISCELLANEOUS) ×6 IMPLANT
COVER BACK TABLE REUSABLE LG (DRAPES) ×3 IMPLANT
COVER WAND RF STERILE (DRAPES) IMPLANT
CUFF TOURN SGL QUICK 24 (TOURNIQUET CUFF)
CUFF TOURN SGL QUICK 34 (TOURNIQUET CUFF) ×2
CUFF TRNQT CYL 24X4X16.5-23 (TOURNIQUET CUFF) IMPLANT
CUFF TRNQT CYL 34X4.125X (TOURNIQUET CUFF) ×4 IMPLANT
DECANTER SPIKE VIAL GLASS SM (MISCELLANEOUS) IMPLANT
DRAPE EXTREMITY BILATERAL (DRAPES) ×3 IMPLANT
DRAPE OEC MINIVIEW 54X84 (DRAPES) ×3 IMPLANT
DRAPE SURG 17X23 STRL (DRAPES) IMPLANT
DRAPE U-SHAPE 47X51 STRL (DRAPES) ×6 IMPLANT
DRSG MEPITEL 4X7.2 (GAUZE/BANDAGES/DRESSINGS) ×3 IMPLANT
DRSG PAD ABDOMINAL 8X10 ST (GAUZE/BANDAGES/DRESSINGS) ×3 IMPLANT
ELECT REM PT RETURN 9FT ADLT (ELECTROSURGICAL) ×3
ELECTRODE REM PT RTRN 9FT ADLT (ELECTROSURGICAL) ×2 IMPLANT
GAUZE SPONGE 4X4 12PLY STRL (GAUZE/BANDAGES/DRESSINGS) ×3 IMPLANT
GLOVE BIO SURGEON STRL SZ 6.5 (GLOVE) ×3 IMPLANT
GLOVE BIO SURGEON STRL SZ8 (GLOVE) ×3 IMPLANT
GLOVE BIOGEL PI IND STRL 7.0 (GLOVE) ×2 IMPLANT
GLOVE BIOGEL PI IND STRL 8 (GLOVE) ×4 IMPLANT
GLOVE BIOGEL PI INDICATOR 7.0 (GLOVE) ×1
GLOVE BIOGEL PI INDICATOR 8 (GLOVE) ×2
GLOVE ECLIPSE 8.0 STRL XLNG CF (GLOVE) ×3 IMPLANT
GOWN STRL REUS W/ TWL LRG LVL3 (GOWN DISPOSABLE) ×2 IMPLANT
GOWN STRL REUS W/ TWL XL LVL3 (GOWN DISPOSABLE) ×4 IMPLANT
GOWN STRL REUS W/TWL LRG LVL3 (GOWN DISPOSABLE) ×1
GOWN STRL REUS W/TWL XL LVL3 (GOWN DISPOSABLE) ×2
K-WIRE .054X4 (WIRE) ×3 IMPLANT
NEEDLE HYPO 22GX1.5 SAFETY (NEEDLE) ×3 IMPLANT
NEEDLE HYPO 25X1 1.5 SAFETY (NEEDLE) IMPLANT
NS IRRIG 1000ML POUR BTL (IV SOLUTION) ×3 IMPLANT
PACK BASIN DAY SURGERY FS (CUSTOM PROCEDURE TRAY) ×3 IMPLANT
PAD CAST 4YDX4 CTTN HI CHSV (CAST SUPPLIES) ×2 IMPLANT
PADDING CAST ABS 4INX4YD NS (CAST SUPPLIES)
PADDING CAST ABS COTTON 4X4 ST (CAST SUPPLIES) IMPLANT
PADDING CAST COTTON 4X4 STRL (CAST SUPPLIES) ×1
PADDING CAST COTTON 6X4 STRL (CAST SUPPLIES) IMPLANT
PENCIL BUTTON HOLSTER BLD 10FT (ELECTRODE) ×3 IMPLANT
SANITIZER HAND PURELL 535ML FO (MISCELLANEOUS) ×3 IMPLANT
SCREW 2.0 CORTICAL FT 20MM (Screw) ×6 IMPLANT
SCREW CORTICAL 2.0X18MM (Screw) ×3 IMPLANT
SCREW HCS TWIST-OFF 2.0X12MM (Screw) ×3 IMPLANT
SHEET MEDIUM DRAPE 40X70 STRL (DRAPES) ×3 IMPLANT
SLEEVE SCD COMPRESS KNEE MED (MISCELLANEOUS) IMPLANT
SPLINT FAST PLASTER 5X30 (CAST SUPPLIES)
SPLINT PLASTER CAST FAST 5X30 (CAST SUPPLIES) IMPLANT
SPONGE LAP 18X18 RF (DISPOSABLE) ×3 IMPLANT
STOCKINETTE 6  STRL (DRAPES) ×2
STOCKINETTE 6 STRL (DRAPES) ×4 IMPLANT
STRIP CLOSURE SKIN 1/2X4 (GAUZE/BANDAGES/DRESSINGS) IMPLANT
SUCTION FRAZIER HANDLE 10FR (MISCELLANEOUS) ×1
SUCTION TUBE FRAZIER 10FR DISP (MISCELLANEOUS) ×2 IMPLANT
SUT ETHILON 3 0 PS 1 (SUTURE) ×9 IMPLANT
SUT MNCRL AB 3-0 PS2 18 (SUTURE) ×3 IMPLANT
SUT VIC AB 0 SH 27 (SUTURE) IMPLANT
SUT VIC AB 2-0 SH 18 (SUTURE) ×3 IMPLANT
SUT VIC AB 2-0 SH 27 (SUTURE)
SUT VIC AB 2-0 SH 27XBRD (SUTURE) IMPLANT
SUT VICRYL 0 UR6 27IN ABS (SUTURE) IMPLANT
SYR BULB 3OZ (MISCELLANEOUS) ×3 IMPLANT
SYR CONTROL 10ML LL (SYRINGE) ×3 IMPLANT
TOWEL GREEN STERILE FF (TOWEL DISPOSABLE) ×6 IMPLANT
TUBE CONNECTING 20X1/4 (TUBING) ×3 IMPLANT
UNDERPAD 30X30 (UNDERPADS AND DIAPERS) ×3 IMPLANT

## 2019-03-03 NOTE — Transfer of Care (Signed)
Immediate Anesthesia Transfer of Care Note  Patient: Angela Patrick  Procedure(s) Performed: Left Foot Scarf/Akin Osteotomy/Bunion Correction; Left Second Metatarsal Weil and Hammertoe Correction (Left Foot) Right Ankle Removal Deep Implant Tibia/Fibula  (Right Ankle)  Patient Location: PACU  Anesthesia Type:General  Level of Consciousness: drowsy and patient cooperative  Airway & Oxygen Therapy: Patient Spontanous Breathing and Patient connected to nasal cannula oxygen  Post-op Assessment: Report given to RN and Post -op Vital signs reviewed and stable  Post vital signs: Reviewed and stable  Last Vitals:  Vitals Value Taken Time  BP    Temp    Pulse 88 03/03/2019 11:13 AM  Resp 16 03/03/2019 11:13 AM  SpO2 100 % 03/03/2019 11:13 AM  Vitals shown include unvalidated device data.  Last Pain:  Vitals:   03/03/19 0818  TempSrc: Oral  PainSc: 8          Complications: No apparent anesthesia complications

## 2019-03-03 NOTE — Anesthesia Procedure Notes (Signed)
Procedure Name: LMA Insertion Date/Time: 03/03/2019 9:19 AM Performed by: Sheryn Bison, CRNA Pre-anesthesia Checklist: Patient identified, Emergency Drugs available, Suction available and Patient being monitored Patient Re-evaluated:Patient Re-evaluated prior to induction Oxygen Delivery Method: Circle system utilized Preoxygenation: Pre-oxygenation with 100% oxygen Induction Type: IV induction Ventilation: Mask ventilation without difficulty LMA: LMA inserted LMA Size: 4.0 Number of attempts: 1 Airway Equipment and Method: Bite block Placement Confirmation: positive ETCO2 Tube secured with: Tape Dental Injury: Teeth and Oropharynx as per pre-operative assessment

## 2019-03-03 NOTE — Discharge Instructions (Addendum)
Post Anesthesia Home Care Instructions  Activity: Get plenty of rest for the remainder of the day. A responsible individual must stay with you for 24 hours following the procedure.  For the next 24 hours, DO NOT: -Drive a car -Advertising copywriter -Drink alcoholic beverages -Take any medication unless instructed by your physician -Make any legal decisions or sign important papers.  Meals: Start with liquid foods such as gelatin or soup. Progress to regular foods as tolerated. Avoid greasy, spicy, heavy foods. If nausea and/or vomiting occur, drink only clear liquids until the nausea and/or vomiting subsides. Call your physician if vomiting continues.  Special Instructions/Symptoms: Your throat may feel dry or sore from the anesthesia or the breathing tube placed in your throat during surgery. If this causes discomfort, gargle with warm salt water. The discomfort should disappear within 24 hours.  If you had a scopolamine patch placed behind your ear for the management of post- operative nausea and/or vomiting:  1. The medication in the patch is effective for 72 hours, after which it should be removed.  Wrap patch in a tissue and discard in the trash. Wash hands thoroughly with soap and water. 2. You may remove the patch earlier than 72 hours if you experience unpleasant side effects which may include dry mouth, dizziness or visual disturbances. 3. Avoid touching the patch. Wash your hands with soap and water after contact with the patch.       Toni Arthurs, MD [facility]  Please read the following information regarding your care after surgery.  Medications  You only need a prescription for the narcotic pain medicine (ex. oxycodone, Percocet, Norco).  All of the other medicines listed below are available over the counter. X Aleve 2 pills twice a day for the first 3 days after surgery. X acetominophen (Tylenol) 650 mg every 4-6 hours as you need for minor to moderate pain X oxycodone  as prescribed for severe pain  Narcotic pain medicine (ex. oxycodone, Percocet, Vicodin) will cause constipation.  To prevent this problem, take the following medicines while you are taking any pain medicine. X docusate sodium (Colace) 100 mg twice a day X senna (Senokot) 2 tablets twice a day  Weight Bearing ? Bear weight when you are able on your operated leg or foot. X Bear weight only on your operated foot in the post-op shoe. ? Do not bear any weight on the operated leg or foot.  Cast / Splint / Dressing X Keep your splint, cast or dressing clean and dry.  Dont put anything (coat hanger, pencil, etc) down inside of it.  If it gets damp, use a hair dryer on the cool setting to dry it.  If it gets soaked, call the office to schedule an appointment for a cast change. ? Remove your dressing 3 days after surgery and cover the incisions with dry dressings.    After your dressing, cast or splint is removed; you may shower, but do not soak or scrub the wound.  Allow the water to run over it, and then gently pat it dry.  Swelling It is normal for you to have swelling where you had surgery.  To reduce swelling and pain, keep your toes above your nose for at least 3 days after surgery.  It may be necessary to keep your foot or leg elevated for several weeks.  If it hurts, it should be elevated.  Follow Up Call my office at [phone] when you are discharged from the hospital or surgery center  to schedule an appointment to be seen two weeks after surgery.  Call my office at [phone] if you develop a fever >101.5 F, nausea, vomiting, bleeding from the surgical site or severe pain.       Post Anesthesia Home Care Instructions  Activity: Get plenty of rest for the remainder of the day. A responsible individual must stay with you for 24 hours following the procedure.  For the next 24 hours, DO NOT: -Drive a car -Advertising copywriterperate machinery -Drink alcoholic beverages -Take any medication unless  instructed by your physician -Make any legal decisions or sign important papers.  Meals: Start with liquid foods such as gelatin or soup. Progress to regular foods as tolerated. Avoid greasy, spicy, heavy foods. If nausea and/or vomiting occur, drink only clear liquids until the nausea and/or vomiting subsides. Call your physician if vomiting continues.  Special Instructions/Symptoms: Your throat may feel dry or sore from the anesthesia or the breathing tube placed in your throat during surgery. If this causes discomfort, gargle with warm salt water. The discomfort should disappear within 24 hours.  If you had a scopolamine patch placed behind your ear for the management of post- operative nausea and/or vomiting:  1. The medication in the patch is effective for 72 hours, after which it should be removed.  Wrap patch in a tissue and discard in the trash. Wash hands thoroughly with soap and water. 2. You may remove the patch earlier than 72 hours if you experience unpleasant side effects which may include dry mouth, dizziness or visual disturbances. 3. Avoid touching the patch. Wash your hands with soap and water after contact with the patch.

## 2019-03-03 NOTE — H&P (Signed)
Angela Patrick is an 68 y.o. female.   Chief Complaint: Right ankle and left foot pain HPI: The patient is a 68 year old female with a history of right ankle fracture.  She has painful hardware at the medial and lateral malleolar.  She also has a painful left foot bunion deformity and a second hammertoe deformity.  She has failed nonoperative treatment for these right ankle and left foot problems.  She presents now for removal of hardware on the right as well as bunion correction and hammertoe correction on the left.  Past Medical History:  Diagnosis Date  . Arthritis    osteoarthritis  . Benign liver cyst 2011  . Complication of anesthesia 02/2018   Difficult breathing, tremors, palpitations, cold chills from previous dental procedure  . Enlarged thyroid   . Goiter    ultrasound in 2014 with fine needle aspiration  . History of orthostatic hypotension     Past Surgical History:  Procedure Laterality Date  . ANKLE SURGERY Right   . HERNIA REPAIR    . THYROIDECTOMY N/A 08/26/2018   Procedure: TOTAL THYROIDECTOMY;  Surgeon: Darnell LevelGerkin, Todd, MD;  Location: [facility];  Service: General;  Laterality: N/A;    Family History  Problem Relation Age of Onset  . CAD Mother   . Goiter Mother   . Cancer Mother   . AAA (abdominal aortic aneurysm) Father   . Cancer Sister    Social History:  reports that she has never smoked. She has never used smokeless tobacco. She reports current alcohol use. She reports that she does not use drugs.  Allergies:  Allergies  Allergen Reactions  . Iodine Swelling    Face and arm swelling  . Penicillins Rash    Has patient had a PCN reaction causing immediate rash, facial/tongue/throat swelling, SOB or lightheadedness with hypotension: no Has patient had a PCN reaction causing severe rash involving mucus membranes or skin necrosis: no  Has patient had a PCN reaction that required hospitalization: no Has patient had a PCN reaction occurring within the last 10  years: no If all of the above answers are "NO", then may proceed with Cephalosporin use.     Medications Prior to Admission  Medication Sig Dispense Refill  . acetaminophen (TYLENOL) 500 MG tablet Take 2 tablets (1,000 mg total) by mouth every 8 (eight) hours as needed. 30 tablet 0  . Ascorbic Acid (VITAMIN C) 1000 MG tablet Take 1,000 mg by mouth daily.    . Biotin 10 MG TABS Take 1 tablet by mouth daily.    . calcium carbonate (TUMS) 500 MG chewable tablet Chew 2 tablets (400 mg of elemental calcium total) by mouth 3 (three) times daily. 90 tablet 1  . Calcium Citrate-Vitamin D (CALCIUM + D PO) Take 1 tablet by mouth daily.    Marland Kitchen. levothyroxine (SYNTHROID) 88 MCG tablet Take 1 tablet (88 mcg total) by mouth daily before breakfast. 30 tablet 3  . Multiple Vitamins-Minerals (CENTRUM SILVER ADULT 50+ PO) Take 1 tablet by mouth every other day.    . traMADol (ULTRAM) 50 MG tablet Take 1-2 tablets (50-100 mg total) by mouth every 6 (six) hours as needed. 15 tablet 0    No results found for this or any previous visit (from the past 48 hour(s)). No results found.  ROS no recent fever, chills, nausea, vomiting or changes in her appetite  Blood pressure (!) 132/95, pulse 70, temperature 98.8 F (37.1 C), temperature source Oral, height 5\' 5"  (1.651 m), weight 73.1  kg, SpO2 99 %. Physical Exam  Well-nourished well-developed woman in no apparent distress.  Alert and oriented x4.  Mood and affect are normal.  Extraocular motions are intact.  Respirations are unlabored.  Gait is normal.  Surgical incisions of the right ankle have healed.  Hardware is prominent medially and laterally.  On the left she has a moderate bunion deformity as well as a significant second hammertoe deformity.  Skin is healthy and intact.  Pulses are palpable.  No lymphadenopathy.  Sensibility to light touch is intact dorsally and plantarly at the forefoot.  Assessment/Plan Painful hardware right distal tibia and fibula; left  bunion and hammertoe deformities -to the operating room today for surgical treatment of both lower extremities.  The risks and benefits of the alternative treatment options have been discussed in detail.  The patient wishes to proceed with surgery and specifically understands risks of bleeding, infection, nerve damage, blood clots, need for additional surgery, amputation and death.   Toni Arthurs, MD Mar 17, 2019, 9:02 AM

## 2019-03-03 NOTE — Anesthesia Postprocedure Evaluation (Signed)
Anesthesia Post Note  Patient: Angela Patrick  Procedure(s) Performed: Left Foot Scarf/Akin Osteotomy/Bunion Correction; Left Second Metatarsal Weil and Hammertoe Correction (Left Foot) Right Ankle Removal Deep Implant Tibia/Fibula  (Right Ankle)     Patient location during evaluation: PACU Anesthesia Type: General Level of consciousness: awake and alert and oriented Pain management: pain level controlled Vital Signs Assessment: post-procedure vital signs reviewed and stable Respiratory status: spontaneous breathing, nonlabored ventilation and respiratory function stable Cardiovascular status: blood pressure returned to baseline and stable Postop Assessment: no apparent nausea or vomiting Anesthetic complications: no    Last Vitals:  Vitals:   03/03/19 1145 03/03/19 1200  BP: 113/84 116/80  Pulse: 74 72  Resp: (!) 30 17  Temp:    SpO2: 100% 100%    Last Pain:  Vitals:   03/03/19 1215  TempSrc:   PainSc: 3                  Claudine Stallings A.

## 2019-03-03 NOTE — Op Note (Signed)
03/03/2019  11:14 AM  PATIENT:  Angela Patrick  68 y.o. female  PRE-OPERATIVE DIAGNOSIS: 1.  Left foot bunion      2.  Left metatarsalgia      3.  Left 2nd hammertoe with dorsal scar contracture      4.  Right distal fibula painful hardware      5.  Right distal tibia painful hardware  POST-OPERATIVE DIAGNOSIS: Same   Procedure(s): 1.  Correction of left foot bunion with double osteotomy (Scarf / Akin) 2.  Left 2nd MTP joint dorsal capsulotomy and extensor tendon lengthening 3.  Left 2nd MT Weil osteotomy 4.  Left foot local scar revision with local soft tissue rearrangement (Z-plasty) 5.  Right distal tibia removal of deep implants 6.  Right distal fibular removal of deep implants 7.  AP and lateral xrays of the left foot 8.  AP and lateral xrays of the right ankle  SURGEON:  Toni Arthurs, MD  ASSISTANT: none  ANESTHESIA:   General  EBL:  minimal   TOURNIQUET:   Total Tourniquet Time Documented: Thigh (Left) - 61 minutes Total: Thigh (Left) - 61 minutes  Thigh (Right) - 29 minutes Total: Thigh (Right) - 29 minutes  COMPLICATIONS:  None apparent  DISPOSITION:  Extubated, awake and stable to recovery.  INDICATION FOR PROCEDURE: The patient is a 68 year old female with a history of right ankle fracture and a painful bunion deformity of her left foot.  She also has a recurrent second hammertoe deformity of the left foot with dorsal scar contracture over the second MTP joint.  She has failed nonoperative treatment for both lower extremity conditions.  She presents today for removal of deep implants of her right distal tibia and right distal fibula.  She also presents for bunion and second hammertoe correction.  The risks and benefits of the alternative treatment options have been discussed in detail.  The patient wishes to proceed with surgery and specifically understands risks of bleeding, infection, nerve damage, blood clots, need for additional surgery, amputation and  death.  PROCEDURE IN DETAIL:  After pre operative consent was obtained, and the correct operative sites were identified, the patient was brought to the operating room and placed supine on the OR table.  Anesthesia was administered.  Pre-operative antibiotics were administered.  A surgical timeout was taken.  Both lower extremities were then prepped and draped in standard sterile fashion with 5 tourniquets in place.  The left lower extremity was elevated and the tourniquet inflated to 250 mmHg.  The bunion deformity was identified.  A medial longitudinal incision was made at the forefoot.  Dissection was carried down to the subcutaneous tissues.  The medial joint capsule was incised and elevated dorsally and plantarly.  The medial eminence was resected in line with the first metatarsal shaft using the oscillating saw.  A scarf osteotomy was then cut in the first metatarsal and mobilized appropriately.  The head of the metatarsal was translated laterally correcting the hallux valgus and intermetatarsal angles.  The osteotomy was fixed with 2 mm Biomet mini frag screws.  Overhanging bone was trimmed with a saw.  The patient still had residual hallux valgus interphalangeus.  An Aiken osteotomy was then performed at the base of the proximal phalanx.  The osteotomy was closed and fixed with a 2 mm bicortical screw.  AP and lateral radiographs confirmed appropriate correction of the bunion deformity in appropriate position and length of all hardware.  The medial wound was irrigated copiously.  Imbricating  sutures of 2-0 Vicryl were used to close the joint capsule.  Subcutaneous tissues were approximated with Monocryl.  The skin incision was closed with nylon.  Attention was turned to the second toe.  A Z-plasty incision was made over the contracted dorsal scar.  Dissection was then carried through the subcutaneous tissues.  The extensor digitorum longus and brevis tendons were both noted to be contracted.  These  were both lengthened.  The dorsal joint capsule was excised.  This allowed adequate plantarflexion of the toe to correct the recurrent hammertoe deformity.  A second metatarsal Weil osteotomy was then performed.  The head of the metatarsal was allowed to retract several millimeters proximally and was fixed with a 2.5 mm Biomet FRS screw.  Overhanging bone was trimmed with a rondure.  AP and lateral radiographs confirmed appropriate correction of the hammertoe deformity and the bunion deformity.  Hardware in the second metatarsal was of the appropriate length.  The dorsal wound was then irrigated copiously.  The extensor tendon was repaired with Vicryl.  The subcutaneous tissues were approximated with Monocryl.  The skin incision was lengthened using a Z-plasty.  Closure was performed with horizontal mattress sutures of 3-0 nylon.  Sterile dressings were applied followed by a bunion wrap.  The tourniquet was released after application of the dressings.    The right lower extremity was then exsanguinated and the tourniquet inflated to 250 mmHg.  The medial ankle incision was opened again sharply and dissection was carried down through the subcutaneous tissues.  The anterior screw was identified.  It was cleaned of all fibrous tissue and removed in its entirety.  The posterior screw was similarly removed without difficulty.  The wound was irrigated and closed with Monocryl and nylon.  Attention was turned to the lateral aspect of the ankle where the previous lateral incision was identified.  It was opened again sharply and dissection was carried down through the subcutaneous tissues to the superficial aspect of the plate.  Plate was cleared of all fibrous tissue.  The screws were removed followed by the plate.  The lag screw was then identified and removed without difficulty.  AP and lateral radiographs of the right ankle show interval removal of all of the hardware.  Both fractures appear healed.  The  medial wound was then irrigated and closed with Vicryl and nylon.  Sterile dressings were applied followed by compression wrap.  The tourniquet was released after application of the dressings.  Both lower extremities were anesthetized with half percent Marcaine with epinephrine for postoperative pain control at the operative sites.  The patient was then awakened from anesthesia and transported to the recovery room in stable condition.  FOLLOW UP PLAN: Weightbearing as tolerated in a flat postop shoe on the left and regular shoe on the right.  Follow-up in 2 weeks for suture removal and to initiate toe taping and a toe spacer.  No indication for DVT prophylaxis in this ambulatory patient.   RADIOGRAPHS: AP and lateral radiographs of the left foot show interval bunion and second hammertoe correction.  Hardware is appropriately positioned and of the appropriate lengths.  AP and lateral radiographs of the right ankle show interval removal of hardware from the distal fibula and distal tibia.  Both fractures appear healed.

## 2019-03-04 ENCOUNTER — Encounter (HOSPITAL_BASED_OUTPATIENT_CLINIC_OR_DEPARTMENT_OTHER): Payer: Self-pay | Admitting: Orthopedic Surgery

## 2021-03-26 ENCOUNTER — Ambulatory Visit: Payer: Federal, State, Local not specified - PPO | Admitting: Allergy and Immunology

## 2021-04-05 ENCOUNTER — Other Ambulatory Visit: Payer: Self-pay

## 2021-04-05 ENCOUNTER — Ambulatory Visit (INDEPENDENT_AMBULATORY_CARE_PROVIDER_SITE_OTHER): Payer: Federal, State, Local not specified - PPO | Admitting: Allergy

## 2021-04-05 ENCOUNTER — Encounter: Payer: Self-pay | Admitting: Allergy

## 2021-04-05 VITALS — BP 172/104 | HR 60 | Temp 97.2°F | Resp 16 | Ht 64.5 in | Wt 164.2 lb

## 2021-04-05 DIAGNOSIS — H1013 Acute atopic conjunctivitis, bilateral: Secondary | ICD-10-CM

## 2021-04-05 DIAGNOSIS — J3089 Other allergic rhinitis: Secondary | ICD-10-CM

## 2021-04-05 DIAGNOSIS — T781XXD Other adverse food reactions, not elsewhere classified, subsequent encounter: Secondary | ICD-10-CM

## 2021-04-05 MED ORDER — EPINEPHRINE 0.3 MG/0.3ML IJ SOAJ: INTRAMUSCULAR | 1 refills | Status: AC

## 2021-04-05 MED ORDER — MONTELUKAST SODIUM 10 MG PO TABS: 10.0000 mg | ORAL_TABLET | Freq: Every day | ORAL | 5 refills | Status: DC

## 2021-04-05 NOTE — Progress Notes (Signed)
New Patient Note  RE: Angela Patrick MRN: [id number] DOB: August 11, 1951 Date of Office Visit: 04/05/2021  Referring provider: Ileana Ladd, MD Primary care provider: Ileana Ladd, MD  Chief Complaint: Allergies  History of present illness: Angela Patrick is a 70 y.o. female presenting today for consultation for environmental allergies.  She believes there is something in her house, namely mold, that is causing her allergy symptoms.  She sees the mold in her home.  She has made her landlord aware of the mold issue but nothing has been done about.  She states her symptoms are not as bad when she is outside the home.  She states she has phlegm in her throat that causes her to gag.  She throat clears often.  She has headaches, runny nose, fatigue and does feel like she has burning eyes. She states for the past 3 years her allergy symptoms are worsening.   She states she takes an allergy tab 2 tabs daily (believes is loratadine) and can't tell if it is effective. She states she will not use a nasal spray.    No history of asthma or eczema. She does note sometimes when she eats certain type of salmon she feels funniness (like tingling of the lip) around her mouth and will stop eating.  She states she has had a throat closure sensation with previous fish ingestion many years ago. With shrimp she states her throat gets tingly and she avoids all shellfish.  When she is going to eat fish she will take benadryl prior.  She also states has noted if she eats peanut items feels her heart races.   She does not have an epinephrine device.   Review of systems: Review of Systems  Constitutional: Negative.   HENT:         See HPI  Eyes:        See HPI  Respiratory: Negative.    Cardiovascular: Negative.   Gastrointestinal: Negative.   Musculoskeletal: Negative.   Skin: Negative.   Neurological: Negative.    All other systems negative unless noted above in HPI  Past medical history: Past  Medical History:  Diagnosis Date   Arthritis    osteoarthritis   Benign liver cyst 2011   Complication of anesthesia 02/2018   Difficult breathing, tremors, palpitations, cold chills from previous dental procedure   Enlarged thyroid    Goiter    ultrasound in 2014 with fine needle aspiration   History of orthostatic hypotension     Past surgical history: Past Surgical History:  Procedure Laterality Date   ANKLE SURGERY Right    BUNIONECTOMY WITH HAMMERTOE RECONSTRUCTION Left 03/03/2019   Procedure: Left Foot Scarf/Akin Osteotomy/Bunion Correction; Left Second Metatarsal Weil and Hammertoe Correction;  Surgeon: Toni Arthurs, MD;  Location: [facility];  Service: Orthopedics;  Laterality: Left;   HARDWARE REMOVAL Right 03/03/2019   Procedure: Right Ankle Removal Deep Implant Tibia/Fibula ;  Surgeon: Toni Arthurs, MD;  Location: [facility];  Service: Orthopedics;  Laterality: Right;   HERNIA REPAIR     THYROIDECTOMY N/A 08/26/2018   Procedure: TOTAL THYROIDECTOMY;  Surgeon: Darnell Level, MD;  Location: [facility];  Service: General;  Laterality: N/A;    Family history:  Family History  Problem Relation Age of Onset   CAD Mother    Goiter Mother    Cancer Mother    AAA (abdominal aortic aneurysm) Father    Pancreatic cancer Sister     Social history:  Lives in a home without carpeting with gas heating and central cooling.  There is no pets in the home.  There is concern for mold or mildew in the home.  No concern for roaches.  She is a IT sales professional in retail.  She denies a smoking history.   Medication List: Current Outpatient Medications  Medication Sig Dispense Refill   Acetaminophen (8 HOUR ARTHRITIS PAIN RELIEVER PO) Take by mouth.     Biotin w/ Vitamins C & E (HAIR SKIN & NAILS GUMMIES PO) Take by mouth.     Calcium Citrate-Vitamin D (CALCIUM + D PO) Take 1 tablet by mouth daily.     Calcium-Phosphorus-Vitamin D (CITRACAL +D3 PO) Take by mouth.      Cholecalciferol (D3-1000) 25 MCG (1000 UT) tablet Take 1,000 Units by mouth daily.     EPINEPHrine 0.3 mg/0.3 mL IJ SOAJ injection Use as directed for life-threatening allergic reaction. 2 each 1   folic acid (FOLVITE) 1 MG tablet Take 1 mg by mouth daily.     GLUCOSAMINE-CHONDROITIN PO Take by mouth.     Homeopathic Products (ZICAM COLD REMEDY PO) Take by mouth.     levothyroxine (SYNTHROID) 100 MCG tablet 1 tablet in the morning on an empty stomach     loratadine (ALLERGY RELIEF) 10 MG tablet Take 10 mg by mouth daily as needed for allergies.     montelukast (SINGULAIR) 10 MG tablet Take 1 tablet (10 mg total) by mouth at bedtime. 30 tablet 5   Multiple Vitamin (MULTIVITAMIN WITH MINERALS) TABS tablet Take by mouth daily.     OVER THE COUNTER MEDICATION Immune Plus     TURMERIC-GINGER PO Take by mouth.     No current facility-administered medications for this visit.    Known medication allergies: Allergies  Allergen Reactions   Iodine Swelling    Face and arm swelling   Penicillins Rash    Has patient had a PCN reaction causing immediate rash, facial/tongue/throat swelling, SOB or lightheadedness with hypotension: no Has patient had a PCN reaction causing severe rash involving mucus membranes or skin necrosis: no  Has patient had a PCN reaction that required hospitalization: no Has patient had a PCN reaction occurring within the last 10 years: no If all of the above answers are "NO", then may proceed with Cephalosporin use.      Physical examination: Blood pressure (!) 172/104, pulse 60, temperature (!) 97.2 F (36.2 C), temperature source Temporal, resp. rate 16, height 5' 4.5" (1.638 m), weight 164 lb 3.2 oz (74.5 kg), SpO2 99 %.  General: Alert, interactive, in no acute distress. HEENT: PERRLA, TMs pearly gray, turbinates mildly edematous with clear discharge, post-pharynx non erythematous. Neck: Supple without lymphadenopathy. Lungs: Clear to auscultation without  wheezing, rhonchi or rales. {no increased work of breathing. CV: Normal S1, S2 without murmurs. Abdomen: Nondistended, nontender. Skin: Warm and dry, without lesions or rashes. Extremities:  No clubbing, cyanosis or edema. Neuro:   Grossly intact.  Diagnositics/Labs:  Allergy testing: Environmental allergy skin prick testing is positive to [place] and cat. Intradermal testing is positive to grass mix, ragweed mix, weed mix and mold mix 1. Select food allergy skin prick testing is negative to peanut, fish and shellfish panel Allergy testing results were read and interpreted by provider, documented by clinical staff.   Assessment and plan: Allergic rhinitis with conjunctivitis  -environmental allergy testing is positive to tree pollen, cat and mold -allergen avoidance measures discussed/handouts provided -change your allergy pill (Loratadine) to  Allegra (Fexofenadine) 180 mg daily -start Singulair 10 mg daily at bedtime. If you notice any change in mood/behavior/sleep after starting Singulair then stop this medication and let us know.  Symptoms resolve after stopping the medication.   -for itchy, watery or red/burning eyes use over-the-counter Pataday 1 drop each eye daily as needed -if medication management is not effective in controlling her symptoms then consider allergen immunotherapy which is a 3 to 5-year process that helps to retrain your body to become tolerant to the above allergens.  If you are no longer reactive to these allergens then you are less likely to become symptomatic and require medication management.  Adverse food reaction -food allergy testing to peanut, fish and shellfish is negative -will obtain serum IgE levels to these foods to determine if you are truly allergic or not -recommend you have access to self-injectable epinephrine (Epipen or AuviQ) 0.3mg  at all times -follow emergency action plan in case of allergic reaction  Follow-up in 4 months or sooner if  needed  I appreciate the opportunity to take part in Cire's care. Please do not hesitate to contact me with questions.  Sincerely,   Margo Aye, MD Allergy/Immunology [facility]

## 2021-04-05 NOTE — Patient Instructions (Addendum)
-  environmental allergy testing is positive to tree pollen, cat and mold -allergen avoidance measures discussed/handouts provided -change your allergy pill (Loratadine) to Allegra (Fexofenadine) 180 mg daily -start Singulair 10 mg daily at bedtime. If you notice any change in mood/behavior/sleep after starting Singulair then stop this medication and let us know.  Symptoms resolve after stopping the medication.   -for itchy, watery or red/burning eyes use over-the-counter Pataday 1 drop each eye daily as needed -if medication management is not effective in controlling her symptoms then consider allergen immunotherapy which is a 3 to 5-year process that helps to retrain your body to become tolerant to the above allergens.  If you are no longer reactive to these allergens then you are less likely to become symptomatic and require medication management.  -food allergy testing to peanut, fish and shellfish is negative -will obtain serum IgE levels to these foods to determine if you are truly allergic or not -recommend you have access to self-injectable epinephrine (Epipen or AuviQ) 0.3mg  at all times -follow emergency action plan in case of allergic reaction  Follow-up in 4 months or sooner if needed

## 2021-04-10 LAB — ALLERGEN PROFILE, FOOD-FISH
Allergen Mackerel IgE: <0.1 kU/L
Allergen Salmon IgE: <0.1 kU/L
Allergen Trout IgE: <0.1 kU/L
Allergen Walley Pike IgE: <0.1 kU/L
Codfish IgE: <0.1 kU/L
Halibut IgE: <0.1 kU/L
Tuna: <0.1 kU/L

## 2021-04-10 LAB — ALLERGEN PROFILE, SHELLFISH
Clam IgE: <0.1 kU/L
F023-IgE Crab: <0.1 kU/L
F080-IgE Lobster: <0.1 kU/L
F290-IgE Oyster: <0.1 kU/L
Scallop IgE: <0.1 kU/L
Shrimp IgE: <0.1 kU/L

## 2021-04-10 LAB — ALLERGEN, PEANUT F13: Peanut IgE: <0.1 kU/L

## 2021-04-11 ENCOUNTER — Telehealth: Payer: Self-pay | Admitting: *Deleted

## 2021-04-11 NOTE — Telephone Encounter (Signed)
Patient called back and I did advise her that letter had been mailed. Patient verbalized understanding and said thank you.

## 2021-04-11 NOTE — Telephone Encounter (Signed)
Letter has been printed and placed up front for pick up in the [place] office for now. Called both phone numbers provided and left a voicemail asking for the patient to return call to determine if she would like the letter mailed to her home or to pick up the letter.

## 2021-04-11 NOTE — Telephone Encounter (Signed)
-----   Message from [facility] Larose Hires, MD sent at 04/11/2021 11:13 AM EDT ----- Regarding: Letter Please create a letter with the following -----------------------------------------------------------------------  To whom it may concern:  Angela Patrick is a patient of mine at the [facility] for management of allergic rhinitis with conjunctivitis.  She has allergies to grass pollen, weed pollen, tree pollen, cat and mold.  Pollens are seasonal outdoor allergens while cat and mold are year-round allergens indoor allergens.  If she has any mold exposure in her home this can lead to a worsening allergic rhinitis and conjunctivitis symptoms.  I have recommended that she decrease her exposure to the allergens she is reactive to which would include mold exposure.  If there is mold in her home this should be remedied as soon as possible to help prevent allergic symptoms and/or illness. Feel free to contact my office of any questions.  Sincerely,     Margo Aye, MD [facility]

## 2021-04-11 NOTE — Telephone Encounter (Signed)
Letter for patient

## 2021-04-11 NOTE — Telephone Encounter (Signed)
Spoke with Dr. Shreeya Lek and she stated that the letter could be mailed and the patient was aware. Letter has been placed in the mail.

## 2021-05-28 ENCOUNTER — Other Ambulatory Visit: Payer: Self-pay | Admitting: Orthopedic Surgery

## 2021-05-28 DIAGNOSIS — T8484XA Pain due to internal orthopedic prosthetic devices, implants and grafts, initial encounter: Secondary | ICD-10-CM

## 2021-06-24 ENCOUNTER — Ambulatory Visit
Admission: RE | Admit: 2021-06-24 | Discharge: 2021-06-24 | Disposition: A | Discharge status: Home / Self Care | Payer: Federal, State, Local not specified - PPO | Source: Ambulatory Visit | Attending: Orthopedic Surgery | Admitting: Orthopedic Surgery

## 2021-06-24 ENCOUNTER — Other Ambulatory Visit: Payer: Self-pay

## 2021-06-24 DIAGNOSIS — T8484XA Pain due to internal orthopedic prosthetic devices, implants and grafts, initial encounter: Secondary | ICD-10-CM

## 2021-07-02 ENCOUNTER — Ambulatory Visit: Payer: Self-pay | Admitting: Student

## 2021-07-15 ENCOUNTER — Ambulatory Visit: Payer: Self-pay | Admitting: Student

## 2021-07-15 NOTE — H&P (Signed)
TOTAL HIP ADMISSION H&P  Patient is admitted for right total hip arthroplasty.  Subjective:  Chief Complaint: right hip pain  HPI: Angela Patrick, 70 y.o. female, has a history of pain and functional disability in the right hip(s) due to arthritis and patient has failed non-surgical conservative treatments for greater than 12 weeks to include NSAID's and/or analgesics and activity modification.  Onset of symptoms was gradual starting 3 years ago with gradually worsening course since that time.The patient noted no past surgery on the right hip(s).  Patient currently rates pain in the right hip at 9 out of 10 with activity. Patient has worsening of pain with activity and weight bearing, pain that interfers with activities of daily living, and pain with passive range of motion. Patient has evidence of subchondral cysts, subchondral sclerosis, and joint space narrowing by imaging studies. This condition presents safety issues increasing the risk of falls. There is no current active infection.  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   Enlarged thyroid 10/22/2015   Chronic lumbar pain 10/22/2015   Past Medical History:  Diagnosis Date   Arthritis    osteoarthritis   Benign liver cyst 2011   Complication of anesthesia 02/2018   Difficult breathing, tremors, palpitations, cold chills from previous dental procedure   Enlarged thyroid    Goiter    ultrasound in 2014 with fine needle aspiration   History of orthostatic hypotension     Past Surgical History:  Procedure Laterality Date   ANKLE SURGERY Right    BUNIONECTOMY WITH HAMMERTOE RECONSTRUCTION Left 03/03/2019   Procedure: Left Foot Scarf/Akin Osteotomy/Bunion Correction; Left Second Metatarsal Weil and Hammertoe Correction;  Surgeon: Toni Arthurs, MD;  Location: [facility];  Service:  Orthopedics;  Laterality: Left;   HARDWARE REMOVAL Right 03/03/2019   Procedure: Right Ankle Removal Deep Implant Tibia/Fibula ;  Surgeon: Toni Arthurs, MD;  Location: [facility];  Service: Orthopedics;  Laterality: Right;   HERNIA REPAIR     THYROIDECTOMY N/A 08/26/2018   Procedure: TOTAL THYROIDECTOMY;  Surgeon: Darnell Level, MD;  Location: [facility];  Service: General;  Laterality: N/A;    Current Outpatient Medications  Medication Sig Dispense Refill Last Dose   Acetaminophen (8 HOUR ARTHRITIS PAIN RELIEVER PO) Take by mouth.      Biotin w/ Vitamins C & E (HAIR SKIN & NAILS GUMMIES PO) Take by mouth.      Calcium Citrate-Vitamin D (CALCIUM + D PO) Take 1 tablet by mouth daily.      Calcium-Phosphorus-Vitamin D (CITRACAL +D3 PO) Take by mouth.      Cholecalciferol (D3-1000) 25 MCG (1000 UT) tablet Take 1,000 Units by mouth daily.      EPINEPHrine 0.3 mg/0.3 mL IJ SOAJ injection Use as directed for life-threatening allergic reaction. 2 each 1    folic acid (FOLVITE) 1 MG tablet Take 1 mg by mouth daily.      GLUCOSAMINE-CHONDROITIN PO Take by mouth.      Homeopathic Products (ZICAM COLD REMEDY PO) Take by mouth.      levothyroxine (SYNTHROID) 100 MCG tablet 1 tablet in the morning on an empty stomach      loratadine (ALLERGY RELIEF) 10 MG tablet Take 10 mg by mouth daily as needed for allergies.      montelukast (SINGULAIR) 10 MG tablet Take 1 tablet (10 mg total) by mouth at bedtime. 30  tablet 5    Multiple Vitamin (MULTIVITAMIN WITH MINERALS) TABS tablet Take by mouth daily.      OVER THE COUNTER MEDICATION Immune Plus      TURMERIC-GINGER PO Take by mouth.      No current facility-administered medications for this visit.   Allergies  Allergen Reactions   Iodine Swelling    Face and arm swelling   Penicillins Rash    Has patient had a PCN reaction causing immediate rash, facial/tongue/throat swelling, SOB or lightheadedness with hypotension: no Has patient had a PCN  reaction causing severe rash involving mucus membranes or skin necrosis: no  Has patient had a PCN reaction that required hospitalization: no Has patient had a PCN reaction occurring within the last 10 years: no If all of the above answers are "NO", then may proceed with Cephalosporin use.     Social History   Tobacco Use   Smoking status: Never   Smokeless tobacco: Never  Substance Use Topics   Alcohol use: Yes    Alcohol/week: 0.0 standard drinks    Comment: 3 glasses of wine weekly    Family History  Problem Relation Age of Onset   CAD Mother    Goiter Mother    Cancer Mother    AAA (abdominal aortic aneurysm) Father    Pancreatic cancer Sister      Review of Systems  Musculoskeletal:  Positive for arthralgias and gait problem.  All other systems reviewed and are negative.  Objective:  Physical Exam HENT:     Head: Normocephalic.  Eyes:     Pupils: Pupils are equal, round, and reactive to light.  Cardiovascular:     Rate and Rhythm: Normal rate.     Pulses: Normal pulses.  Pulmonary:     Effort: Pulmonary effort is normal.  Abdominal:     Palpations: Abdomen is soft.  Genitourinary:    Comments: Deferred Musculoskeletal:     Cervical back: Normal range of motion.     Comments: Right hip painful ROM  Skin:    General: Skin is warm and dry.  Neurological:     Mental Status: She is alert and oriented to person, place, and time.  Psychiatric:        Behavior: Behavior normal.    Vital signs in last 24 hours: @VSRANGES @  Labs:   Estimated body mass index is 27.75 kg/m as calculated from the following:   Height as of 04/05/21: 5' 4.5" (1.638 m).   Weight as of 04/05/21: 74.5 kg.   Imaging Review Plain radiographs demonstrate severe degenerative joint disease of the right hip(s). The bone quality appears to be adequate for age and reported activity level.      Assessment/Plan:  End stage arthritis, right hip(s)  The patient history, physical  examination, clinical judgement of the provider and imaging studies are consistent with end stage degenerative joint disease of the right hip(s) and total hip arthroplasty is deemed medically necessary. The treatment options including medical management, injection therapy, arthroscopy and arthroplasty were discussed at length. The risks and benefits of total hip arthroplasty were presented and reviewed. The risks due to aseptic loosening, infection, stiffness, dislocation/subluxation,  thromboembolic complications and other imponderables were discussed.  The patient acknowledged the explanation, agreed to proceed with the plan and consent was signed. Patient is being admitted for inpatient treatment for surgery, pain control, PT, OT, prophylactic antibiotics, VTE prophylaxis, progressive ambulation and ADL's and discharge planning.The patient is planning to be discharged  home same  day

## 2021-07-15 NOTE — H&P (View-Only) (Signed)
TOTAL HIP ADMISSION H&P  Patient is admitted for right total hip arthroplasty.  Subjective:  Chief Complaint: right hip pain  HPI: Angela Patrick, 70 y.o. female, has a history of pain and functional disability in the right hip(s) due to arthritis and patient has failed non-surgical conservative treatments for greater than 12 weeks to include NSAID's and/or analgesics and activity modification.  Onset of symptoms was gradual starting 3 years ago with gradually worsening course since that time.The patient noted no past surgery on the right hip(s).  Patient currently rates pain in the right hip at 9 out of 10 with activity. Patient has worsening of pain with activity and weight bearing, pain that interfers with activities of daily living, and pain with passive range of motion. Patient has evidence of subchondral cysts, subchondral sclerosis, and joint space narrowing by imaging studies. This condition presents safety issues increasing the risk of falls. There is no current active infection.  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   Enlarged thyroid 10/22/2015   Chronic lumbar pain 10/22/2015   Past Medical History:  Diagnosis Date   Arthritis    osteoarthritis   Benign liver cyst 2011   Complication of anesthesia 02/2018   Difficult breathing, tremors, palpitations, cold chills from previous dental procedure   Enlarged thyroid    Goiter    ultrasound in 2014 with fine needle aspiration   History of orthostatic hypotension     Past Surgical History:  Procedure Laterality Date   ANKLE SURGERY Right    BUNIONECTOMY WITH HAMMERTOE RECONSTRUCTION Left 03/03/2019   Procedure: Left Foot Scarf/Akin Osteotomy/Bunion Correction; Left Second Metatarsal Weil and Hammertoe Correction;  Surgeon: Hewitt, John, MD;  Location: [facility];  Service:  Orthopedics;  Laterality: Left;   HARDWARE REMOVAL Right 03/03/2019   Procedure: Right Ankle Removal Deep Implant Tibia/Fibula ;  Surgeon: Hewitt, John, MD;  Location: [facility];  Service: Orthopedics;  Laterality: Right;   HERNIA REPAIR     THYROIDECTOMY N/A 08/26/2018   Procedure: TOTAL THYROIDECTOMY;  Surgeon: Gerkin, Todd, MD;  Location: [facility];  Service: General;  Laterality: N/A;    Current Outpatient Medications  Medication Sig Dispense Refill Last Dose   Acetaminophen (8 HOUR ARTHRITIS PAIN RELIEVER PO) Take by mouth.      Biotin w/ Vitamins C & E (HAIR SKIN & NAILS GUMMIES PO) Take by mouth.      Calcium Citrate-Vitamin D (CALCIUM + D PO) Take 1 tablet by mouth daily.      Calcium-Phosphorus-Vitamin D (CITRACAL +D3 PO) Take by mouth.      Cholecalciferol (D3-1000) 25 MCG (1000 UT) tablet Take 1,000 Units by mouth daily.      EPINEPHrine 0.3 mg/0.3 mL IJ SOAJ injection Use as directed for life-threatening allergic reaction. 2 each 1    folic acid (FOLVITE) 1 MG tablet Take 1 mg by mouth daily.      GLUCOSAMINE-CHONDROITIN PO Take by mouth.      Homeopathic Products (ZICAM COLD REMEDY PO) Take by mouth.      levothyroxine (SYNTHROID) 100 MCG tablet 1 tablet in the morning on an empty stomach      loratadine (ALLERGY RELIEF) 10 MG tablet Take 10 mg by mouth daily as needed for allergies.      montelukast (SINGULAIR) 10 MG tablet Take 1 tablet (10 mg total) by mouth at bedtime. 30   tablet 5    Multiple Vitamin (MULTIVITAMIN WITH MINERALS) TABS tablet Take by mouth daily.      OVER THE COUNTER MEDICATION Immune Plus      TURMERIC-GINGER PO Take by mouth.      No current facility-administered medications for this visit.   Allergies  Allergen Reactions   Iodine Swelling    Face and arm swelling   Penicillins Rash    Has patient had a PCN reaction causing immediate rash, facial/tongue/throat swelling, SOB or lightheadedness with hypotension: no Has patient had a PCN  reaction causing severe rash involving mucus membranes or skin necrosis: no  Has patient had a PCN reaction that required hospitalization: no Has patient had a PCN reaction occurring within the last 10 years: no If all of the above answers are "NO", then may proceed with Cephalosporin use.     Social History   Tobacco Use   Smoking status: Never   Smokeless tobacco: Never  Substance Use Topics   Alcohol use: Yes    Alcohol/week: 0.0 standard drinks    Comment: 3 glasses of wine weekly    Family History  Problem Relation Age of Onset   CAD Mother    Goiter Mother    Cancer Mother    AAA (abdominal aortic aneurysm) Father    Pancreatic cancer Sister      Review of Systems  Musculoskeletal:  Positive for arthralgias and gait problem.  All other systems reviewed and are negative.  Objective:  Physical Exam HENT:     Head: Normocephalic.  Eyes:     Pupils: Pupils are equal, round, and reactive to light.  Cardiovascular:     Rate and Rhythm: Normal rate.     Pulses: Normal pulses.  Pulmonary:     Effort: Pulmonary effort is normal.  Abdominal:     Palpations: Abdomen is soft.  Genitourinary:    Comments: Deferred Musculoskeletal:     Cervical back: Normal range of motion.     Comments: Right hip painful ROM  Skin:    General: Skin is warm and dry.  Neurological:     Mental Status: She is alert and oriented to person, place, and time.  Psychiatric:        Behavior: Behavior normal.    Vital signs in last 24 hours: @VSRANGES@  Labs:   Estimated body mass index is 27.75 kg/m as calculated from the following:   Height as of 04/05/21: 5' 4.5" (1.638 m).   Weight as of 04/05/21: 74.5 kg.   Imaging Review Plain radiographs demonstrate severe degenerative joint disease of the right hip(s). The bone quality appears to be adequate for age and reported activity level.      Assessment/Plan:  End stage arthritis, right hip(s)  The patient history, physical  examination, clinical judgement of the provider and imaging studies are consistent with end stage degenerative joint disease of the right hip(s) and total hip arthroplasty is deemed medically necessary. The treatment options including medical management, injection therapy, arthroscopy and arthroplasty were discussed at length. The risks and benefits of total hip arthroplasty were presented and reviewed. The risks due to aseptic loosening, infection, stiffness, dislocation/subluxation,  thromboembolic complications and other imponderables were discussed.  The patient acknowledged the explanation, agreed to proceed with the plan and consent was signed. Patient is being admitted for inpatient treatment for surgery, pain control, PT, OT, prophylactic antibiotics, VTE prophylaxis, progressive ambulation and ADL's and discharge planning.The patient is planning to be discharged  home same   day

## 2021-07-22 ENCOUNTER — Encounter (HOSPITAL_COMMUNITY)
Admission: RE | Admit: 2021-07-22 | Discharge: 2021-07-22 | Disposition: A | Discharge status: Home / Self Care | Payer: Federal, State, Local not specified - PPO | Source: Ambulatory Visit | Attending: Orthopedic Surgery | Admitting: Orthopedic Surgery

## 2021-07-22 ENCOUNTER — Encounter (HOSPITAL_COMMUNITY): Payer: Self-pay

## 2021-07-22 ENCOUNTER — Other Ambulatory Visit: Payer: Self-pay

## 2021-07-22 DIAGNOSIS — Z01812 Encounter for preprocedural laboratory examination: Secondary | ICD-10-CM | POA: Diagnosis present

## 2021-07-22 HISTORY — DX: Hypothyroidism, unspecified: E03.9

## 2021-07-22 LAB — SURGICAL PCR SCREEN
MRSA, PCR: NEGATIVE
Staphylococcus aureus: NEGATIVE

## 2021-07-22 LAB — PROTIME-INR
INR: 1 (ref 0.8–1.2)
Prothrombin Time: 13.1 seconds (ref 11.4–15.2)

## 2021-07-22 LAB — COMPREHENSIVE METABOLIC PANEL
ALT: 12 U/L (ref 0–44)
AST: 24 U/L (ref 15–41)
Albumin: 4.1 g/dL (ref 3.5–5.0)
Alkaline Phosphatase: 56 U/L (ref 38–126)
Anion gap: 5 (ref 5–15)
BUN: 9 mg/dL (ref 8–23)
CO2: 29 mmol/L (ref 22–32)
Calcium: 9.3 mg/dL (ref 8.9–10.3)
Chloride: 106 mmol/L (ref 98–111)
Creatinine, Ser: 0.82 mg/dL (ref 0.44–1.00)
GFR, Estimated: >60 mL/min (ref >60)
Glucose, Bld: 98 mg/dL (ref 70–99)
Potassium: 3.9 mmol/L (ref 3.5–5.1)
Sodium: 140 mmol/L (ref 135–145)
Total Bilirubin: 0.9 mg/dL (ref 0.3–1.2)
Total Protein: 7.1 g/dL (ref 6.5–8.1)

## 2021-07-22 LAB — CBC
HCT: 40.1 % (ref 36.0–46.0)
Hemoglobin: 13.2 g/dL (ref 12.0–15.0)
MCH: 28.7 pg (ref 26.0–34.0)
MCHC: 32.9 g/dL (ref 30.0–36.0)
MCV: 87.2 fL (ref 80.0–100.0)
Platelets: 359 10*3/uL (ref 150–400)
RBC: 4.6 MIL/uL (ref 3.87–5.11)
RDW: 13.1 % (ref 11.5–15.5)
WBC: 5.6 10*3/uL (ref 4.0–10.5)
nRBC: 0 % (ref 0.0–0.2)

## 2021-07-22 NOTE — Patient Instructions (Signed)
DUE TO COVID-19 ONLY ONE VISITOR IS ALLOWED TO COME WITH YOU AND STAY IN THE WAITING ROOM ONLY DURING PRE OP AND PROCEDURE DAY OF SURGERY IF YOU ARE GOING HOME AFTER SURGERY. IF YOU ARE SPENDING THE NIGHT 2 PEOPLE MAY VISIT WITH YOU IN YOUR PRIVATE ROOM AFTER SURGERY UNTIL VISITING  HOURS ARE OVER AT 800 PM AND THE 2 VISITORS CANNOT SPEND THE NIGHT.                  Angela Patrick     Your procedure is scheduled on: 07/31/21   Report to [facility] Main  Entrance   Report to short stay at 5:15 AM     Call this number if you have problems the morning of surgery [phone]    No food after midnight.    You may have clear liquid until 4:30 AM.    At 4:00 AM drink pre surgery drink.   Nothing by mouth after 4:30 AM.   CLEAR LIQUID DIET   Foods Allowed                                                                     Foods Excluded  Coffee and tea, regular and decaf                             liquids that you cannot  Plain Jell-O any favor except red or purple                                           see through such as: Fruit ices (not with fruit pulp)                                     milk, soups, orange juice  Iced Popsicles                                    All solid food Carbonated beverages, regular and diet                                    Cranberry, grape and apple juices Sports drinks like Gatorade Lightly seasoned clear broth or consume(fat free) Sugar     BRUSH YOUR TEETH MORNING OF SURGERY AND RINSE YOUR MOUTH OUT, NO CHEWING GUM CANDY OR MINTS.     Take these medicines the morning of surgery with A SIP OF WATER: Levothyroxine                                 You may not have any metal on your body including hair pins and              piercings  Do not wear jewelry, make-up, lotions, powders or perfumes, deodorant  Do not wear nail polish on your fingernails.  Do not shave  48 hours prior to surgery.                Do not  bring valuables to the hospital. [facility] IS NOT             RESPONSIBLE   FOR VALUABLES.  Contacts, dentures or bridgework may not be worn into surgery.       Patients discharged the day of surgery will not be allowed to drive home.  IF YOU ARE HAVING SURGERY AND GOING HOME THE SAME DAY, YOU MUST HAVE AN ADULT TO DRIVE YOU HOME AND BE WITH YOU FOR 24 HOURS. YOU MAY GO HOME BY TAXI OR UBER OR ORTHERWISE, BUT AN ADULT MUST ACCOMPANY YOU HOME AND STAY WITH YOU FOR 24 HOURS.  Name and phone number of your driver:  Special Instructions: N/A              Please read over the following fact sheets you were given: _____________________________________________________________________             [facility] - Preparing for Surgery Before surgery, you can play an important role.  Because skin is not sterile, your skin needs to be as free of germs as possible.  You can reduce the number of germs on your skin by washing with CHG (chlorahexidine gluconate) soap before surgery.  CHG is an antiseptic cleaner which kills germs and bonds with the skin to continue killing germs even after washing. Please DO NOT use if you have an allergy to CHG or antibacterial soaps.  If your skin becomes reddened/irritated stop using the CHG and inform your nurse when you arrive at Short Stay. Do not shave (including legs and underarms) for at least 48 hours prior to the first CHG shower.  You may shave your face/neck. Please follow these instructions carefully:  1.  Shower with CHG Soap the night before surgery and the  morning of Surgery.  2.  If you choose to wash your hair, wash your hair first as usual with your  normal  shampoo.  3.  After you shampoo, rinse your hair and body thoroughly to remove the  shampoo.                            4.  Use CHG as you would any other liquid soap.  You can apply chg directly  to the skin and wash                       Gently with a scrungie or clean washcloth.  5.  Apply the  CHG Soap to your body ONLY FROM THE NECK DOWN.   Do not use on face/ open                           Wound or open sores. Avoid contact with eyes, ears mouth and genitals (private parts).                       Wash face,  Genitals (private parts) with your normal soap.             6.  Wash thoroughly, paying special attention to the area where your surgery  will be performed.  7.  Thoroughly rinse your body with warm water from the neck  down.  8.  DO NOT shower/wash with your normal soap after using and rinsing off  the CHG Soap.                9.  Pat yourself dry with a clean towel.            10.  Wear clean pajamas.            11.  Place clean sheets on your bed the night of your first shower and do not  sleep with pets. Day of Surgery : Do not apply any lotions/deodorants the morning of surgery.  Please wear clean clothes to the hospital/surgery center.  FAILURE TO FOLLOW THESE INSTRUCTIONS MAY RESULT IN THE CANCELLATION OF YOUR SURGERY PATIENT SIGNATURE_________________________________  NURSE SIGNATURE__________________________________  ________________________________________________________________________   Angela Patrick  An incentive spirometer is a tool that can help keep your lungs clear and active. This tool measures how well you are filling your lungs with each breath. Taking long deep breaths may help reverse or decrease the chance of developing breathing (pulmonary) problems (especially infection) following: A long period of time when you are unable to move or be active. BEFORE THE PROCEDURE  If the spirometer includes an indicator to show your best effort, your nurse or respiratory therapist will set it to a desired goal. If possible, sit up straight or lean slightly forward. Try not to slouch. Hold the incentive spirometer in an upright position. INSTRUCTIONS FOR USE  Sit on the edge of your bed if possible, or sit up as far as you can in bed or on a chair. Hold  the incentive spirometer in an upright position. Breathe out normally. Place the mouthpiece in your mouth and seal your lips tightly around it. Breathe in slowly and as deeply as possible, raising the piston or the ball toward the top of the column. Hold your breath for 3-5 seconds or for as long as possible. Allow the piston or ball to fall to the bottom of the column. Remove the mouthpiece from your mouth and breathe out normally. Rest for a few seconds and repeat Steps 1 through 7 at least 10 times every 1-2 hours when you are awake. Take your time and take a few normal breaths between deep breaths. The spirometer may include an indicator to show your best effort. Use the indicator as a goal to work toward during each repetition. After each set of 10 deep breaths, practice coughing to be sure your lungs are clear. If you have an incision (the cut made at the time of surgery), support your incision when coughing by placing a pillow or rolled up towels firmly against it. Once you are able to get out of bed, walk around indoors and cough well. You may stop using the incentive spirometer when instructed by your caregiver.  RISKS AND COMPLICATIONS Take your time so you do not get dizzy or light-headed. If you are in pain, you may need to take or ask for pain medication before doing incentive spirometry. It is harder to take a deep breath if you are having pain. AFTER USE Rest and breathe slowly and easily. It can be helpful to keep track of a log of your progress. Your caregiver can provide you with a simple table to help with this. If you are using the spirometer at home, follow these instructions: SEEK MEDICAL CARE IF:  You are having difficultly using the spirometer. You have trouble using the spirometer as often as instructed. Your  pain medication is not giving enough relief while using the spirometer. You develop fever of 100.5 F (38.1 C) or higher. SEEK IMMEDIATE MEDICAL CARE IF:  You  cough up bloody sputum that had not been present before. You develop fever of 102 F (38.9 C) or greater. You develop worsening pain at or near the incision site. MAKE SURE YOU:  Understand these instructions. Will watch your condition. Will get help right away if you are not doing well or get worse. Document Released: 02/09/2007 Document Revised: 12/22/2011 Document Reviewed: 04/12/2007 [facility] Patient Information 2014 [place], [place].   ________________________________________________________________________

## 2021-07-22 NOTE — Progress Notes (Signed)
COVID test- NA  PCP - Dr. Karna Dupes Cardiologist - none  Chest x-ray - no EKG - no Stress Test - no ECHO - no Cardiac Cath - no Pacemaker/ICD device last checked:NA  Sleep Study - no CPAP -   Fasting Blood Sugar - NA Checks Blood Sugar _____ times a day  Blood Thinner Instructions:NA Aspirin Instructions: Last Dose:  Anesthesia review: no  Patient denies shortness of breath, fever, cough and chest pain at PAT appointment Pt has no SOB with activities. She used to walk every day before her hip started hurting.  Patient verbalized understanding of instructions that were given to them at the PAT appointment. Patient was also instructed that they will need to review over the PAT instructions again at home before surgery. yes

## 2021-07-25 ENCOUNTER — Encounter: Payer: Self-pay | Admitting: Allergy

## 2021-07-25 ENCOUNTER — Other Ambulatory Visit: Payer: Self-pay

## 2021-07-25 ENCOUNTER — Ambulatory Visit: Payer: Federal, State, Local not specified - PPO | Admitting: Allergy

## 2021-07-25 VITALS — BP 100/82 | HR 80 | Temp 97.3°F | Resp 16 | Ht 65.5 in | Wt 148.0 lb

## 2021-07-25 DIAGNOSIS — T781XXD Other adverse food reactions, not elsewhere classified, subsequent encounter: Secondary | ICD-10-CM

## 2021-07-25 DIAGNOSIS — H1013 Acute atopic conjunctivitis, bilateral: Secondary | ICD-10-CM | POA: Diagnosis not present

## 2021-07-25 DIAGNOSIS — J3089 Other allergic rhinitis: Secondary | ICD-10-CM | POA: Diagnosis not present

## 2021-07-25 MED ORDER — MONTELUKAST SODIUM 10 MG PO TABS: 10.0000 mg | ORAL_TABLET | Freq: Every day | ORAL | 5 refills | Status: DC

## 2021-07-25 NOTE — Progress Notes (Signed)
Follow-up Note  RE: Angela Patrick MRN: [id number] DOB: 1951/08/23 Date of Office Visit: 07/25/2021   History of present illness: Angela Patrick is a 70 y.o. female presenting today for follow-up of allergic rhinitis with conjunctivitis and adverse food reaction.  She is scheduled to have hip surgery next week.  She states she is still having a lot of symptoms as her apartment still has mold.  She states her landlord keeps covering it up but not fixing the actual issue.  She is noting migraines, poor sleep, raw throat due to phlegm and throat clearing.  She does feel a lot better when she is outside of the home.  She feels like the Allegra does help mostly with the allergy symptoms she would have related to outdoor allergens.  She does feel that the Singulair has helped a little bit.  She has not had to use any antihistamine based eyedrops.  She is hopeful that she will be able to move out of this apartment and into her own home soon. She avoids peanut, fish and shellfish.  She states she has tried fish again and it still causes a tingling sensation in her throat.  She does have access to an epinephrine device    Review of systems: Review of Systems  Constitutional:  Positive for malaise/fatigue.  HENT:  Positive for congestion and sore throat.   Eyes: Negative.   Respiratory: Negative.    Cardiovascular: Negative.   Gastrointestinal: Negative.   Musculoskeletal: Negative.   Skin: Negative.   Neurological:  Positive for headaches.   All other systems negative unless noted above in HPI  Past medical/social/surgical/family history have been reviewed and are unchanged unless specifically indicated below.  No changes  Medication List: Current Outpatient Medications  Medication Sig Dispense Refill   acetaminophen (TYLENOL) 650 MG CR tablet Take 650 mg by mouth daily as needed (pain).     Biotin w/ Vitamins C & E (HAIR SKIN & NAILS GUMMIES PO) Take by mouth.      Calcium-Phosphorus-Vitamin D (CITRACAL +D3 PO) Take 1,200 mg by mouth daily. 1200 mg / 1000 mg     EPINEPHrine 0.3 mg/0.3 mL IJ SOAJ injection Use as directed for life-threatening allergic reaction. 2 each 1   fexofenadine (ALLEGRA) 180 MG tablet Take 180 mg by mouth daily.     folic acid (FOLVITE) 1 MG tablet Take 1 mg by mouth daily.     levothyroxine (SYNTHROID) 100 MCG tablet Take 100 mcg by mouth daily before breakfast.     montelukast (SINGULAIR) 10 MG tablet Take 1 tablet (10 mg total) by mouth at bedtime. 30 tablet 5   Multiple Vitamin (MULTIVITAMIN WITH MINERALS) TABS tablet Take 1 tablet by mouth daily. gummies     TURMERIC-GINGER PO Take 500 mg by mouth daily. Curcumin     No current facility-administered medications for this visit.     Known medication allergies: Allergies  Allergen Reactions   Iodine Swelling    Face and arm swelling   Penicillins Rash    Has patient had a PCN reaction causing immediate rash, facial/tongue/throat swelling, SOB or lightheadedness with hypotension: no Has patient had a PCN reaction causing severe rash involving mucus membranes or skin necrosis: no  Has patient had a PCN reaction that required hospitalization: no Has patient had a PCN reaction occurring within the last 10 years: no If all of the above answers are "NO", then may proceed with Cephalosporin use.      Physical examination: Blood  pressure 100/82, pulse 80, temperature (!) 97.3 F (36.3 C), resp. rate 16, height 5' 5.5" (1.664 m), weight 148 lb (67.1 kg), SpO2 98 %.  General: Alert, interactive, in no acute distress. HEENT: PERRLA, TMs pearly gray, turbinates moderately edematous without discharge, post-pharynx non erythematous. Neck: Supple without lymphadenopathy. Lungs: Clear to auscultation without wheezing, rhonchi or rales. {no increased work of breathing. CV: Normal S1, S2 without murmurs. Abdomen: Nondistended, nontender. Skin: Warm and dry, without lesions or  rashes. Extremities:  No clubbing, cyanosis or edema. Neuro:   Grossly intact.  Diagnositics/Labs: None today  Assessment and plan: Allergic rhinitis with conjunctivitis  -continue avoidance measures for tree pollen, cat and mold -continue Allegra (Fexofenadine) 180 mg daily (max dosing is up to 4 tabs a day) -continue Singulair 10 mg daily at bedtime.  -start nasal Xhance 2 sprays each nostril up to twice a day for nasal congestion control.  Samples provided today   -for itchy, watery or red/burning eyes use over-the-counter Pataday 1 drop each eye daily as needed -if medication management is not effective in controlling her symptoms then consider allergen immunotherapy which is a 3 to 5-year process that helps to retrain your body to become tolerant to the above allergens.  If you are no longer reactive to these allergens then you are less likely to become symptomatic and require medication management.  Adverse food reaction -food allergy skin testing and blood testing to peanut, fish and shellfish are negative -since you do note symptoms with fish then continue avoidance -recommend you have access to self-injectable epinephrine (Epipen or AuviQ) 0.3mg  at all times -follow emergency action plan in case of allergic reaction  Follow-up in 4 months or sooner if needed  I appreciate the opportunity to take part in Angela Patrick's care. Please do not hesitate to contact me with questions.  Sincerely,   Margo Aye, MD Allergy/Immunology [facility]

## 2021-07-25 NOTE — Patient Instructions (Addendum)
-  continue avoidance measures for tree pollen, cat and mold -continue Allegra (Fexofenadine) 180 mg daily (max dosing is up to 4 tabs a day) -continue Singulair 10 mg daily at bedtime.  -start nasal Xhance 2 sprays each nostril up to twice a day for nasal congestion control.  Samples provided today   -for itchy, watery or red/burning eyes use over-the-counter Pataday 1 drop each eye daily as needed -if medication management is not effective in controlling her symptoms then consider allergen immunotherapy which is a 3 to 5-year process that helps to retrain your body to become tolerant to the above allergens.  If you are no longer reactive to these allergens then you are less likely to become symptomatic and require medication management.  -food allergy skin testing and blood testing to peanut, fish and shellfish are negative -since you do note symptoms with fish then continue avoidance -recommend you have access to self-injectable epinephrine (Epipen or AuviQ) 0.3mg  at all times -follow emergency action plan in case of allergic reaction  Follow-up in 4 months or sooner if needed

## 2021-07-30 NOTE — Anesthesia Preprocedure Evaluation (Addendum)
Anesthesia Evaluation  Patient identified by MRN, date of birth, ID band Patient awake    Reviewed: Allergy & Precautions, NPO status , Patient's Chart, lab work & pertinent test results  Airway Mallampati: I  TM Distance: >3 FB Neck ROM: Full    Dental no notable dental hx.    Pulmonary neg pulmonary ROS,    Pulmonary exam normal        Cardiovascular negative cardio ROS Normal cardiovascular exam     Neuro/Psych negative neurological ROS  negative psych ROS   GI/Hepatic negative GI ROS, Neg liver ROS,   Endo/Other  Hypothyroidism   Renal/GU negative Renal ROS  negative genitourinary   Musculoskeletal  (+) Arthritis , Osteoarthritis,    Abdominal Normal abdominal exam  (+)   Peds  Hematology negative hematology ROS (+)   Anesthesia Other Findings   Reproductive/Obstetrics                            Anesthesia Physical Anesthesia Plan  ASA: 2  Anesthesia Plan: Spinal   Post-op Pain Management:    Induction:   PONV Risk Score and Plan: Ondansetron, Dexamethasone and Treatment may vary due to age or medical condition  Airway Management Planned: Natural Airway and Simple Face Mask  Additional Equipment: None  Intra-op Plan:   Post-operative Plan:   Informed Consent: I have reviewed the patients History and Physical, chart, labs and discussed the procedure including the risks, benefits and alternatives for the proposed anesthesia with the patient or authorized representative who has indicated his/her understanding and acceptance.       Plan Discussed with: CRNA  Anesthesia Plan Comments:        Anesthesia Quick Evaluation

## 2021-07-31 ENCOUNTER — Other Ambulatory Visit: Payer: Self-pay

## 2021-07-31 ENCOUNTER — Ambulatory Visit (HOSPITAL_COMMUNITY)
Admission: RE | Admit: 2021-07-31 | Discharge: 2021-08-02 | Disposition: A | Discharge status: Home / Self Care | Payer: Federal, State, Local not specified - PPO | Attending: Orthopedic Surgery | Admitting: Orthopedic Surgery

## 2021-07-31 ENCOUNTER — Ambulatory Visit (HOSPITAL_COMMUNITY): Payer: Federal, State, Local not specified - PPO | Admitting: Anesthesiology

## 2021-07-31 ENCOUNTER — Ambulatory Visit (HOSPITAL_COMMUNITY): Payer: Federal, State, Local not specified - PPO

## 2021-07-31 ENCOUNTER — Encounter (HOSPITAL_COMMUNITY): Payer: Self-pay | Admitting: Orthopedic Surgery

## 2021-07-31 ENCOUNTER — Encounter (HOSPITAL_COMMUNITY)
Admission: RE | Disposition: A | Discharge status: Home / Self Care | Payer: Self-pay | Source: Home / Self Care | Attending: Orthopedic Surgery

## 2021-07-31 DIAGNOSIS — E876 Hypokalemia: Secondary | ICD-10-CM | POA: Insufficient documentation

## 2021-07-31 DIAGNOSIS — Z79899 Other long term (current) drug therapy: Secondary | ICD-10-CM | POA: Diagnosis not present

## 2021-07-31 DIAGNOSIS — Z888 Allergy status to other drugs, medicaments and biological substances status: Secondary | ICD-10-CM | POA: Insufficient documentation

## 2021-07-31 DIAGNOSIS — R0981 Nasal congestion: Secondary | ICD-10-CM | POA: Diagnosis not present

## 2021-07-31 DIAGNOSIS — R4182 Altered mental status, unspecified: Secondary | ICD-10-CM | POA: Diagnosis not present

## 2021-07-31 DIAGNOSIS — D649 Anemia, unspecified: Secondary | ICD-10-CM | POA: Diagnosis not present

## 2021-07-31 DIAGNOSIS — R519 Headache, unspecified: Secondary | ICD-10-CM | POA: Diagnosis not present

## 2021-07-31 DIAGNOSIS — E039 Hypothyroidism, unspecified: Secondary | ICD-10-CM | POA: Diagnosis present

## 2021-07-31 DIAGNOSIS — M169 Osteoarthritis of hip, unspecified: Secondary | ICD-10-CM | POA: Insufficient documentation

## 2021-07-31 DIAGNOSIS — R739 Hyperglycemia, unspecified: Secondary | ICD-10-CM | POA: Insufficient documentation

## 2021-07-31 DIAGNOSIS — Z20822 Contact with and (suspected) exposure to covid-19: Secondary | ICD-10-CM | POA: Diagnosis not present

## 2021-07-31 DIAGNOSIS — K5900 Constipation, unspecified: Secondary | ICD-10-CM | POA: Diagnosis not present

## 2021-07-31 DIAGNOSIS — Z96649 Presence of unspecified artificial hip joint: Secondary | ICD-10-CM

## 2021-07-31 DIAGNOSIS — Z79890 Hormone replacement therapy (postmenopausal): Secondary | ICD-10-CM | POA: Insufficient documentation

## 2021-07-31 DIAGNOSIS — Z880 Allergy status to penicillin: Secondary | ICD-10-CM | POA: Insufficient documentation

## 2021-07-31 DIAGNOSIS — Z09 Encounter for follow-up examination after completed treatment for conditions other than malignant neoplasm: Secondary | ICD-10-CM

## 2021-07-31 DIAGNOSIS — S72041A Displaced fracture of base of neck of right femur, initial encounter for closed fracture: Secondary | ICD-10-CM

## 2021-07-31 DIAGNOSIS — I959 Hypotension, unspecified: Secondary | ICD-10-CM | POA: Diagnosis not present

## 2021-07-31 DIAGNOSIS — M1611 Unilateral primary osteoarthritis, right hip: Secondary | ICD-10-CM | POA: Diagnosis present

## 2021-07-31 HISTORY — PX: TOTAL HIP ARTHROPLASTY: SHX124

## 2021-07-31 LAB — TYPE AND SCREEN
ABO/RH(D): O POS
Antibody Screen: NEGATIVE

## 2021-07-31 LAB — ABO/RH: ABO/RH(D): O POS

## 2021-07-31 SURGERY — ARTHROPLASTY, HIP, TOTAL, ANTERIOR APPROACH
Anesthesia: Spinal | Site: Hip | Laterality: Right

## 2021-07-31 MED ORDER — KETOROLAC TROMETHAMINE 30 MG/ML IJ SOLN
INTRAMUSCULAR | Status: DC | PRN
  Administered 2021-07-31: 30 mg

## 2021-07-31 MED ORDER — CEFAZOLIN SODIUM-DEXTROSE 2-4 GM/100ML-% IV SOLN
2.0000 g | INTRAVENOUS | Status: AC
  Administered 2021-07-31: 2 g via INTRAVENOUS
  Filled 2021-07-31: qty 100

## 2021-07-31 MED ORDER — IRRISEPT - 450ML BOTTLE WITH 0.05% CHG IN STERILE WATER, USP 99.95% OPTIME
TOPICAL | Status: DC | PRN
  Administered 2021-07-31: 450 mL

## 2021-07-31 MED ORDER — KETOROLAC TROMETHAMINE 15 MG/ML IJ SOLN
INTRAMUSCULAR | Status: AC
  Filled 2021-07-31: qty 1

## 2021-07-31 MED ORDER — PHENYLEPHRINE HCL-NACL 20-0.9 MG/250ML-% IV SOLN
INTRAVENOUS | Status: DC | PRN
  Administered 2021-07-31: 50 ug/min via INTRAVENOUS

## 2021-07-31 MED ORDER — CHLORHEXIDINE GLUCONATE 0.12 % MT SOLN
15.0000 mL | Freq: Once | OROMUCOSAL | Status: AC
  Administered 2021-07-31: 15 mL via OROMUCOSAL

## 2021-07-31 MED ORDER — ORAL CARE MOUTH RINSE
15.0000 mL | Freq: Once | OROMUCOSAL | Status: AC
Start: 2021-07-31 — End: 2021-07-31

## 2021-07-31 MED ORDER — HYDROMORPHONE HCL 1 MG/ML IJ SOLN
INTRAMUSCULAR | Status: AC
  Filled 2021-07-31: qty 1

## 2021-07-31 MED ORDER — WATER FOR IRRIGATION, STERILE IR SOLN
Status: DC | PRN
  Administered 2021-07-31: 2000 mL

## 2021-07-31 MED ORDER — POVIDONE-IODINE 10 % EX SWAB
2.0000 | Freq: Once | CUTANEOUS | Status: DC
Start: 2021-07-31 — End: 2021-07-31

## 2021-07-31 MED ORDER — SENNA 8.6 MG PO TABS: 2.0000 | ORAL_TABLET | Freq: Every day | ORAL | 1 refills | Status: AC

## 2021-07-31 MED ORDER — LACTATED RINGERS IV SOLN: INTRAVENOUS | Status: DC

## 2021-07-31 MED ORDER — SODIUM CHLORIDE (PF) 0.9 % IJ SOLN
INTRAMUSCULAR | Status: DC | PRN
  Administered 2021-07-31: 30 mL

## 2021-07-31 MED ORDER — BUPIVACAINE-EPINEPHRINE (PF) 0.25% -1:200000 IJ SOLN
INTRAMUSCULAR | Status: AC
  Filled 2021-07-31: qty 30

## 2021-07-31 MED ORDER — POVIDONE-IODINE 10 % EX SWAB: 2.0000 "application " | Freq: Once | CUTANEOUS | Status: DC

## 2021-07-31 MED ORDER — LACTATED RINGERS IV BOLUS
500.0000 mL | Freq: Once | INTRAVENOUS | Status: AC
  Administered 2021-07-31: 500 mL via INTRAVENOUS

## 2021-07-31 MED ORDER — HYDROCODONE-ACETAMINOPHEN 7.5-325 MG PO TABS
ORAL_TABLET | ORAL | Status: AC
  Administered 2021-07-31: 1 via ORAL
  Filled 2021-07-31: qty 1

## 2021-07-31 MED ORDER — LACTATED RINGERS IV BOLUS
250.0000 mL | Freq: Once | INTRAVENOUS | Status: AC
  Administered 2021-07-31: 250 mL via INTRAVENOUS

## 2021-07-31 MED ORDER — SODIUM CHLORIDE (PF) 0.9 % IJ SOLN
INTRAMUSCULAR | Status: AC
  Filled 2021-07-31: qty 30

## 2021-07-31 MED ORDER — ONDANSETRON HCL 4 MG PO TABS: 4.0000 mg | ORAL_TABLET | Freq: Three times a day (TID) | ORAL | 0 refills | Status: AC | PRN

## 2021-07-31 MED ORDER — HYDROCODONE-ACETAMINOPHEN 7.5-325 MG PO TABS
ORAL_TABLET | ORAL | Status: AC
  Filled 2021-07-31: qty 1

## 2021-07-31 MED ORDER — 0.9 % SODIUM CHLORIDE (POUR BTL) OPTIME
TOPICAL | Status: DC | PRN
  Administered 2021-07-31: 1000 mL

## 2021-07-31 MED ORDER — ONDANSETRON HCL 4 MG PO TABS
4.0000 mg | ORAL_TABLET | Freq: Four times a day (QID) | ORAL | Status: DC | PRN
  Filled 2021-07-31: qty 1

## 2021-07-31 MED ORDER — PROMETHAZINE HCL 25 MG/ML IJ SOLN: 6.2500 mg | INTRAMUSCULAR | Status: DC | PRN

## 2021-07-31 MED ORDER — TRANEXAMIC ACID-NACL 1000-0.7 MG/100ML-% IV SOLN: 1000.0000 mg | Freq: Once | INTRAVENOUS | Status: AC

## 2021-07-31 MED ORDER — KETOROLAC TROMETHAMINE 15 MG/ML IJ SOLN
7.5000 mg | Freq: Four times a day (QID) | INTRAMUSCULAR | Status: AC
  Administered 2021-07-31 – 2021-08-01 (×3): 7.5 mg via INTRAVENOUS
  Filled 2021-07-31 (×3): qty 1

## 2021-07-31 MED ORDER — PROPOFOL 1000 MG/100ML IV EMUL
INTRAVENOUS | Status: AC
  Filled 2021-07-31: qty 100

## 2021-07-31 MED ORDER — KETOROLAC TROMETHAMINE 30 MG/ML IJ SOLN
INTRAMUSCULAR | Status: AC
  Filled 2021-07-31: qty 1

## 2021-07-31 MED ORDER — PHENYLEPHRINE 40 MCG/ML (10ML) SYRINGE FOR IV PUSH (FOR BLOOD PRESSURE SUPPORT)
PREFILLED_SYRINGE | INTRAVENOUS | Status: AC
  Filled 2021-07-31: qty 10

## 2021-07-31 MED ORDER — ACETAMINOPHEN 10 MG/ML IV SOLN
1000.0000 mg | Freq: Once | INTRAVENOUS | Status: AC
  Administered 2021-07-31: 1000 mg via INTRAVENOUS
  Filled 2021-07-31: qty 100

## 2021-07-31 MED ORDER — PHENYLEPHRINE HCL (PRESSORS) 10 MG/ML IV SOLN
INTRAVENOUS | Status: AC
  Filled 2021-07-31: qty 2

## 2021-07-31 MED ORDER — ISOPROPYL ALCOHOL 70 % SOLN
Status: DC | PRN
  Administered 2021-07-31: 1 via TOPICAL

## 2021-07-31 MED ORDER — BUPIVACAINE IN DEXTROSE 0.75-8.25 % IT SOLN
INTRATHECAL | Status: DC | PRN
  Administered 2021-07-31: 1.6 mL via INTRATHECAL

## 2021-07-31 MED ORDER — MEPERIDINE HCL 50 MG/ML IJ SOLN: 6.2500 mg | INTRAMUSCULAR | Status: DC | PRN

## 2021-07-31 MED ORDER — ONDANSETRON HCL 4 MG/2ML IJ SOLN
INTRAMUSCULAR | Status: AC
  Administered 2021-07-31: 4 mg via INTRAVENOUS
  Filled 2021-07-31: qty 2

## 2021-07-31 MED ORDER — CEFAZOLIN SODIUM-DEXTROSE 2-4 GM/100ML-% IV SOLN
INTRAVENOUS | Status: AC
  Filled 2021-07-31: qty 100

## 2021-07-31 MED ORDER — HYDROCODONE-ACETAMINOPHEN 5-325 MG PO TABS: 1.0000 | ORAL_TABLET | ORAL | 0 refills | Status: DC | PRN

## 2021-07-31 MED ORDER — SODIUM CHLORIDE 0.9 % IR SOLN
Status: DC | PRN
  Administered 2021-07-31: 1000 mL

## 2021-07-31 MED ORDER — ACETAMINOPHEN 10 MG/ML IV SOLN: 1000.0000 mg | Freq: Once | INTRAVENOUS | Status: DC | PRN

## 2021-07-31 MED ORDER — HYDROMORPHONE HCL 1 MG/ML IJ SOLN
0.2500 mg | INTRAMUSCULAR | Status: DC | PRN
  Administered 2021-07-31 (×2): 0.5 mg via INTRAVENOUS

## 2021-07-31 MED ORDER — PHENYLEPHRINE 40 MCG/ML (10ML) SYRINGE FOR IV PUSH (FOR BLOOD PRESSURE SUPPORT)
PREFILLED_SYRINGE | INTRAVENOUS | Status: DC | PRN
  Administered 2021-07-31 (×3): 80 ug via INTRAVENOUS

## 2021-07-31 MED ORDER — ISOPROPYL ALCOHOL 70 % SOLN
Status: AC
  Filled 2021-07-31: qty 480

## 2021-07-31 MED ORDER — TRANEXAMIC ACID-NACL 1000-0.7 MG/100ML-% IV SOLN
1000.0000 mg | INTRAVENOUS | Status: AC
  Administered 2021-07-31: 1000 mg via INTRAVENOUS
  Filled 2021-07-31: qty 100

## 2021-07-31 MED ORDER — HYDROCODONE-ACETAMINOPHEN 7.5-325 MG PO TABS
1.0000 | ORAL_TABLET | ORAL | Status: DC | PRN
  Administered 2021-07-31: 2 via ORAL
  Administered 2021-07-31: 1 via ORAL
  Administered 2021-08-02: 2 via ORAL
  Filled 2021-07-31 (×2): qty 2

## 2021-07-31 MED ORDER — PROPOFOL 500 MG/50ML IV EMUL
INTRAVENOUS | Status: DC | PRN
  Administered 2021-07-31: 100 ug/kg/min via INTRAVENOUS

## 2021-07-31 MED ORDER — METHOCARBAMOL 500 MG IVPB - SIMPLE MED
500.0000 mg | Freq: Four times a day (QID) | INTRAVENOUS | Status: DC | PRN
  Filled 2021-07-31: qty 50

## 2021-07-31 MED ORDER — METHOCARBAMOL 500 MG PO TABS
500.0000 mg | ORAL_TABLET | Freq: Four times a day (QID) | ORAL | Status: DC | PRN
  Administered 2021-07-31 – 2021-08-02 (×5): 500 mg via ORAL
  Filled 2021-07-31 (×6): qty 1

## 2021-07-31 MED ORDER — HYDROCODONE-ACETAMINOPHEN 5-325 MG PO TABS
1.0000 | ORAL_TABLET | ORAL | Status: DC | PRN
  Administered 2021-08-01 – 2021-08-02 (×4): 2 via ORAL
  Filled 2021-07-31 (×4): qty 2

## 2021-07-31 MED ORDER — PROPOFOL 10 MG/ML IV BOLUS
INTRAVENOUS | Status: AC
  Filled 2021-07-31: qty 20

## 2021-07-31 MED ORDER — CEFAZOLIN SODIUM-DEXTROSE 2-4 GM/100ML-% IV SOLN
2.0000 g | Freq: Four times a day (QID) | INTRAVENOUS | Status: AC
Start: 2021-07-31 — End: 2021-08-01
  Administered 2021-07-31 – 2021-08-01 (×2): 2 g via INTRAVENOUS
  Filled 2021-07-31: qty 100

## 2021-07-31 MED ORDER — ASPIRIN 81 MG PO CHEW: 81.0000 mg | CHEWABLE_TABLET | Freq: Two times a day (BID) | ORAL | 0 refills | Status: DC

## 2021-07-31 MED ORDER — ONDANSETRON HCL 4 MG/2ML IJ SOLN
4.0000 mg | Freq: Four times a day (QID) | INTRAMUSCULAR | Status: DC | PRN
  Filled 2021-07-31: qty 2

## 2021-07-31 MED ORDER — MELOXICAM 15 MG PO TABS: 15.0000 mg | ORAL_TABLET | Freq: Every day | ORAL | 3 refills | Status: DC

## 2021-07-31 MED ORDER — TRANEXAMIC ACID-NACL 1000-0.7 MG/100ML-% IV SOLN
INTRAVENOUS | Status: AC
  Administered 2021-07-31: 1000 mg via INTRAVENOUS
  Filled 2021-07-31: qty 100

## 2021-07-31 MED ORDER — DOCUSATE SODIUM 100 MG PO CAPS: 100.0000 mg | ORAL_CAPSULE | Freq: Two times a day (BID) | ORAL | 1 refills | Status: AC

## 2021-07-31 MED ORDER — METHOCARBAMOL 500 MG IVPB - SIMPLE MED
INTRAVENOUS | Status: AC
  Administered 2021-07-31: 500 mg via INTRAVENOUS
  Filled 2021-07-31: qty 50

## 2021-07-31 MED ORDER — SODIUM CHLORIDE 0.9 % IV SOLN: INTRAVENOUS | Status: DC

## 2021-07-31 MED ORDER — BUPIVACAINE-EPINEPHRINE 0.25% -1:200000 IJ SOLN
INTRAMUSCULAR | Status: DC | PRN
  Administered 2021-07-31: 30 mL

## 2021-07-31 SURGICAL SUPPLY — 57 items
ACETAB CUP W/GRIPTION 54 (Plate) ×2 IMPLANT
BAG COUNTER SPONGE SURGICOUNT (BAG) IMPLANT
BAG DECANTER FOR FLEXI CONT (MISCELLANEOUS) IMPLANT
BAG ZIPLOCK 12X15 (MISCELLANEOUS) IMPLANT
CHLORAPREP W/TINT 26 (MISCELLANEOUS) ×2 IMPLANT
COVER PERINEAL POST (MISCELLANEOUS) ×2 IMPLANT
COVER SURGICAL LIGHT HANDLE (MISCELLANEOUS) ×2 IMPLANT
CUP ACETAB W/GRIPTION 54 (Plate) ×1 IMPLANT
DECANTER SPIKE VIAL GLASS SM (MISCELLANEOUS) ×2 IMPLANT
DERMABOND ADVANCED (GAUZE/BANDAGES/DRESSINGS) ×1
DERMABOND ADVANCED .7 DNX12 (GAUZE/BANDAGES/DRESSINGS) ×1 IMPLANT
DRAPE IMP U-DRAPE 54X76 (DRAPES) ×2 IMPLANT
DRAPE SHEET LG 3/4 BI-LAMINATE (DRAPES) ×6 IMPLANT
DRAPE STERI IOBAN 125X83 (DRAPES) ×2 IMPLANT
DRAPE U-SHAPE 47X51 STRL (DRAPES) ×4 IMPLANT
DRSG AQUACEL AG ADV 3.5X10 (GAUZE/BANDAGES/DRESSINGS) ×2 IMPLANT
ELECT REM PT RETURN 15FT ADLT (MISCELLANEOUS) ×2 IMPLANT
GAUZE SPONGE 4X4 12PLY STRL (GAUZE/BANDAGES/DRESSINGS) ×2 IMPLANT
GLOVE SRG 8 PF TXTR STRL LF DI (GLOVE) ×1 IMPLANT
GLOVE SURG ENC MOIS LTX SZ8.5 (GLOVE) ×4 IMPLANT
GLOVE SURG ENC TEXT LTX SZ7.5 (GLOVE) ×4 IMPLANT
GLOVE SURG UNDER POLY LF SZ8 (GLOVE) ×1
GLOVE SURG UNDER POLY LF SZ8.5 (GLOVE) ×2 IMPLANT
GOWN SPEC L3 XXLG W/TWL (GOWN DISPOSABLE) ×2 IMPLANT
GOWN STRL REUS W/TWL XL LVL3 (GOWN DISPOSABLE) ×2 IMPLANT
HANDPIECE INTERPULSE COAX TIP (DISPOSABLE) ×1
HEAD CERAMIC 36 PLUS5 (Hips) ×2 IMPLANT
HOLDER FOLEY CATH W/STRAP (MISCELLANEOUS) ×2 IMPLANT
HOOD PEEL AWAY FLYTE STAYCOOL (MISCELLANEOUS) ×8 IMPLANT
JET LAVAGE IRRISEPT WOUND (IRRIGATION / IRRIGATOR) ×2
LAVAGE JET IRRISEPT WOUND (IRRIGATION / IRRIGATOR) ×1 IMPLANT
LINER NEUTRAL 54X36MM PLUS 4 (Hips) ×2 IMPLANT
MANIFOLD NEPTUNE II (INSTRUMENTS) ×2 IMPLANT
MARKER SKIN DUAL TIP RULER LAB (MISCELLANEOUS) ×2 IMPLANT
NDL SAFETY ECLIPSE 18X1.5 (NEEDLE) ×1 IMPLANT
NEEDLE HYPO 18GX1.5 SHARP (NEEDLE) ×1
NEEDLE SPNL 18GX3.5 QUINCKE PK (NEEDLE) ×2 IMPLANT
PACK ANTERIOR HIP CUSTOM (KITS) ×2 IMPLANT
PENCIL SMOKE EVACUATOR (MISCELLANEOUS) IMPLANT
SAW OSC TIP CART 19.5X105X1.3 (SAW) ×2 IMPLANT
SEALER BIPOLAR AQUA 6.0 (INSTRUMENTS) ×2 IMPLANT
SET HNDPC FAN SPRY TIP SCT (DISPOSABLE) ×1 IMPLANT
STAPLER INSORB 30 2030 C-SECTI (MISCELLANEOUS) ×2 IMPLANT
STEM TRI LOC BPS SZ4 W GRIPTON (Hips) ×1 IMPLANT
SUT MNCRL AB 3-0 PS2 18 (SUTURE) ×2 IMPLANT
SUT MNCRL AB 4-0 PS2 18 (SUTURE) ×2 IMPLANT
SUT MON AB 2-0 CT1 36 (SUTURE) ×4 IMPLANT
SUT STRATAFIX PDO 1 14 VIOLET (SUTURE) ×1
SUT STRATFX PDO 1 14 VIOLET (SUTURE) ×1
SUT VIC AB 2-0 CT1 27 (SUTURE) ×1
SUT VIC AB 2-0 CT1 TAPERPNT 27 (SUTURE) ×1 IMPLANT
SUTURE STRATFX PDO 1 14 VIOLET (SUTURE) ×1 IMPLANT
SYR 3ML LL SCALE MARK (SYRINGE) ×2 IMPLANT
TRAY FOLEY MTR SLVR 16FR STAT (SET/KITS/TRAYS/PACK) IMPLANT
TRI LOC BPS SZ 4 W GRIPTON (Hips) ×2 IMPLANT
TUBE SUCTION HIGH CAP CLEAR NV (SUCTIONS) ×2 IMPLANT
WATER STERILE IRR 1000ML POUR (IV SOLUTION) ×2 IMPLANT

## 2021-07-31 NOTE — Addendum Note (Signed)
Addendum  created 07/31/21 1327 by Lucinda Dell, CRNA   Intraprocedure Meds edited

## 2021-07-31 NOTE — Interval H&P Note (Signed)
History and Physical Interval Note:  07/31/2021 8:09 AM  Angela Patrick  has presented today for surgery, with the diagnosis of Right hip osteoarthritis.  The various methods of treatment have been discussed with the patient and family. After consideration of risks, benefits and other options for treatment, the patient has consented to  Procedure(s): TOTAL HIP ARTHROPLASTY ANTERIOR APPROACH (Right) as a surgical intervention.  The patient's history has been reviewed, patient examined, no change in status, stable for surgery.  I have reviewed the patient's chart and labs.  Questions were answered to the patient's satisfaction.     Iline Oven Voris Tigert

## 2021-07-31 NOTE — Evaluation (Signed)
Physical Therapy Evaluation Patient Details Name: Angela Patrick MRN: [id number] DOB: 20-May-1951 Today's Date: 07/31/2021  History of Present Illness  Patient is 70 y.o. female s/p Rt THA anterior approach on 07/31/21 with PMH significant for OA, hypothyroidism.  Clinical Impression  Angela Patrick is a 70 y.o. female POD 0 s/p Rt THA. Patient reports independence with mobility at baseline. Patient is now limited by functional impairments (see PT problem list below) and requires min assist for bed mobility and transfers with RW. Patient was limited by symptomatic hypotension (see vitals) and required assist to return to supine and was place in trendelenburg until symptoms resolved. Patient will benefit from continued skilled PT interventions to address impairments and progress towards PLOF. Acute PT will follow to progress mobility and stair training in preparation for safe discharge home.       Vitals Time             BP                  Position 1300             121/86            Supine 1315             116/83            Supine 1336             101/74            Sit 1337             84/73              Sit (after stand) 1343             121/86            Supine    Recommendations for follow up therapy are one component of a multi-disciplinary discharge planning process, led by the attending physician.  Recommendations may be updated based on patient status, additional functional criteria and insurance authorization.  Follow Up Recommendations Follow surgeon's recommendation for DC plan and follow-up therapies    Equipment Recommendations  Rolling walker with 5" wheels    Recommendations for Other Services       Precautions / Restrictions Precautions Precautions: Fall Restrictions Other Position/Activity Restrictions: WBAT      Mobility  Bed Mobility Overal bed mobility: Needs Assistance Bed Mobility: Supine to Sit;Sit to Supine     Supine to sit: Min assist;HOB  elevated Sit to supine: Min assist;Mod assist;HOB elevated   General bed mobility comments: Min assist for cues to bring LE's off EOB and use belt to assist Rt LE. Min-Mod Assist to return to supine due to hypotension.    Transfers Overall transfer level: Needs assistance Equipment used: Rolling walker (2 wheeled) Transfers: Sit to/from Stand Sit to Stand: Min assist         General transfer comment: pt required min assist for power up and mildly unsteady with rise. Pt c/o dizziness/weak sensation and noted to be hypotensive. returned to sit EOB with min assist to initiate. (see vitals)  Ambulation/Gait                Stairs            Wheelchair Mobility    Modified Rankin (Stroke Patients Only)       Balance Overall balance assessment: Needs assistance Sitting-balance support: Feet supported Sitting balance-Leahy Scale: Good     Standing balance support:  During functional activity;Bilateral upper extremity supported Standing balance-Leahy Scale: Poor                               Pertinent Vitals/Pain Pain Assessment: 0-10 Pain Score: 2  Pain Location: Rt hip Pain Descriptors / Indicators: Aching;Burning Pain Intervention(s): Limited activity within patient's tolerance;Monitored during session;Repositioned;Patient requesting pain meds-RN notified;Ice applied    Home Living Family/patient expects to be discharged to:: Private residence Living Arrangements: Alone Available Help at Discharge: Family Type of Home: House Home Access: Stairs to enter Entrance Stairs-Rails: None Entrance Stairs-Number of Steps: 1 Home Layout: One level Home Equipment: Grab bars - tub/shower;Crutches      Prior Function Level of Independence: Independent;Independent with assistive device(s)         Comments: walking with crutch for about 1 year     Hand Dominance   Dominant Hand: Right    Extremity/Trunk Assessment   Upper Extremity  Assessment Upper Extremity Assessment: Overall WFL for tasks assessed    Lower Extremity Assessment Lower Extremity Assessment: Overall WFL for tasks assessed    Cervical / Trunk Assessment Cervical / Trunk Assessment: Normal  Communication   Communication: No difficulties  Cognition Arousal/Alertness: Awake/alert Behavior During Therapy: WFL for tasks assessed/performed Overall Cognitive Status: Within Functional Limits for tasks assessed                                        General Comments      Exercises     Assessment/Plan    PT Assessment Patient needs continued PT services  PT Problem List Decreased strength;Decreased activity tolerance;Decreased balance;Decreased range of motion;Decreased mobility;Decreased knowledge of use of DME;Decreased knowledge of precautions       PT Treatment Interventions DME instruction;Gait training;Stair training;Functional mobility training;Therapeutic activities;Therapeutic exercise;Balance training;Patient/family education    PT Goals (Current goals can be found in the Care Plan section)  Acute Rehab PT Goals Patient Stated Goal: get home safely PT Goal Formulation: With patient Time For Goal Achievement: 08/07/21 Potential to Achieve Goals: Good    Frequency 7X/week   Barriers to discharge        Co-evaluation               AM-PAC PT "6 Clicks" Mobility  Outcome Measure Help needed turning from your back to your side while in a flat bed without using bedrails?: A Little Help needed moving from lying on your back to sitting on the side of a flat bed without using bedrails?: A Little Help needed moving to and from a bed to a chair (including a wheelchair)?: A Little Help needed standing up from a chair using your arms (e.g., wheelchair or bedside chair)?: A Little Help needed to walk in hospital room?: A Lot Help needed climbing 3-5 steps with a railing? : A Lot 6 Click Score: 16    End of Session  Equipment Utilized During Treatment: Gait belt Activity Tolerance: Treatment limited secondary to medical complications (Comment) (orthostatic hypotension) Patient left: in bed;with call bell/phone within reach Nurse Communication: Mobility status PT Visit Diagnosis: Muscle weakness (generalized) (M62.81);Difficulty in walking, not elsewhere classified (R26.2)    Time: 2440-1027 PT Time Calculation (min) (ACUTE ONLY): 23 min   Charges:   PT Evaluation $PT Eval Low Complexity: 1 Low PT Treatments $Therapeutic Activity: 8-22 mins  Wynn Maudlin, DPT Acute Rehabilitation Services Office [phone] Pager [phone]   Anitra Lauth 07/31/2021, 7:39 PM

## 2021-07-31 NOTE — Transfer of Care (Signed)
Immediate Anesthesia Transfer of Care Note  Patient: Angela Patrick  Procedure(s) Performed: TOTAL HIP ARTHROPLASTY ANTERIOR APPROACH (Right: Hip)  Patient Location: PACU  Anesthesia Type:Spinal  Level of Consciousness: drowsy and patient cooperative  Airway & Oxygen Therapy: Patient Spontanous Breathing and Patient connected to face mask oxygen  Post-op Assessment: Report given to RN and Post -op Vital signs reviewed and stable  Post vital signs: Reviewed and stable  Last Vitals:  Vitals Value Taken Time  BP 108/72 07/31/21 1051  Temp    Pulse 61 07/31/21 1052  Resp 20 07/31/21 1052  SpO2 100 % 07/31/21 1052  Vitals shown include unvalidated device data.  Last Pain:  Vitals:   07/31/21 0657  TempSrc: Oral         Complications: No notable events documented.

## 2021-07-31 NOTE — Anesthesia Postprocedure Evaluation (Signed)
Anesthesia Post Note  Patient: Angela Patrick  Procedure(s) Performed: TOTAL HIP ARTHROPLASTY ANTERIOR APPROACH (Right: Hip)     Patient location during evaluation: PACU Anesthesia Type: Spinal Level of consciousness: awake Pain management: pain level controlled Vital Signs Assessment: post-procedure vital signs reviewed and stable Respiratory status: spontaneous breathing Cardiovascular status: stable Postop Assessment: no apparent nausea or vomiting, no headache, no backache, spinal receding and patient able to bend at knees Anesthetic complications: no   No notable events documented.  Last Vitals:  Vitals:   07/31/21 1115 07/31/21 1130  BP: 124/84 117/78  Pulse: (!) 50 (!) 51  Resp: 10 18  Temp:    SpO2: 100% 97%    Last Pain:  Vitals:   07/31/21 1130  TempSrc:   PainSc: 5                  John F Chinmay Squier Jr

## 2021-07-31 NOTE — Discharge Instructions (Signed)
? ?Dr. Hubbert Landrigan ?Joint Replacement Specialist ?[facility] ?[address] ?[place], [place] [address] ?[phone] ? ? ?TOTAL HIP REPLACEMENT POSTOPERATIVE DIRECTIONS ? ? ? ?Hip Rehabilitation, Guidelines Following Surgery  ? ?WEIGHT BEARING ?Weight bearing as tolerated with assist device (walker, cane, etc) as directed, use it as long as suggested by your surgeon or therapist, typically at least 4-6 weeks. ? ?The results of a hip operation are greatly improved after range of motion and muscle strengthening exercises. Follow all safety measures which are given to protect your hip. If any of these exercises cause increased pain or swelling in your joint, decrease the amount until you are comfortable again. Then slowly increase the exercises. Call your caregiver if you have problems or questions.  ? ?HOME CARE INSTRUCTIONS  ?Most of the following instructions are designed to prevent the dislocation of your new hip.  ?Remove items at home which could result in a fall. This includes throw rugs or furniture in walking pathways.  ?Continue medications as instructed at time of discharge. ?You may have some home medications which will be placed on hold until you complete the course of blood thinner medication. ?You may start showering once you are discharged home. Do not remove your dressing. ?Do not put on socks or shoes without following the instructions of your caregivers.   ?Sit on chairs with arms. Use the chair arms to help push yourself up when arising.  ?Arrange for the use of a toilet seat elevator so you are not sitting low.  ?Walk with walker as instructed.  ?You may resume a sexual relationship in one month or when given the OK by your caregiver.  ?Use walker as long as suggested by your caregivers.  ?You may put full weight on your legs and walk as much as is comfortable. ?Avoid periods of inactivity such as sitting longer than an hour when not asleep. This helps prevent blood  clots.  ?You may return to work once you are cleared by your surgeon.  ?Do not drive a car for 6 weeks or until released by your surgeon.  ?Do not drive while taking narcotics.  ?Wear elastic stockings for two weeks following surgery during the day but you may remove then at night.  ?Make sure you keep all of your appointments after your operation with all of your doctors and caregivers. You should call the office at the above phone number and make an appointment for approximately two weeks after the date of your surgery. ?Please pick up a stool softener and laxative for home use as long as you are requiring pain medications. ?ICE to the affected hip every three hours for 30 minutes at a time and then as needed for pain and swelling. Continue to use ice on the hip for pain and swelling from surgery. You may notice swelling that will progress down to the foot and ankle.  This is normal after surgery.  Elevate the leg when you are not up walking on it.   ?It is important for you to complete the blood thinner medication as prescribed by your doctor. ?Continue to use the breathing machine which will help keep your temperature down.  It is common for your temperature to cycle up and down following surgery, especially at night when you are not up moving around and exerting yourself.  The breathing machine keeps your lungs expanded and your temperature down. ? ?RANGE OF MOTION AND STRENGTHENING EXERCISES  ?These exercises are designed to help you   keep full movement of your hip joint. Follow your caregiver's or physical therapist's instructions. Perform all exercises about fifteen times, three times per day or as directed. Exercise both hips, even if you have had only one joint replacement. These exercises can be done on a training (exercise) mat, on the floor, on a table or on a bed. Use whatever works the best and is most comfortable for you. Use music or television while you are exercising so that the exercises are a  pleasant break in your day. This will make your life better with the exercises acting as a break in routine you can look forward to.  ?Lying on your back, slowly slide your foot toward your buttocks, raising your knee up off the floor. Then slowly slide your foot back down until your leg is straight again.  ?Lying on your back spread your legs as far apart as you can without causing discomfort.  ?Lying on your side, raise your upper leg and foot straight up from the floor as far as is comfortable. Slowly lower the leg and repeat.  ?Lying on your back, tighten up the muscle in the front of your thigh (quadriceps muscles). You can do this by keeping your leg straight and trying to raise your heel off the floor. This helps strengthen the largest muscle supporting your knee.  ?Lying on your back, tighten up the muscles of your buttocks both with the legs straight and with the knee bent at a comfortable angle while keeping your heel on the floor.  ? ?SKILLED REHAB INSTRUCTIONS: ?If the patient is transferred to a skilled rehab facility following release from the hospital, a list of the current medications will be sent to the facility for the patient to continue.  When discharged from the skilled rehab facility, please have the facility set up the patient's Home Health Physical Therapy prior to being released. Also, the skilled facility will be responsible for providing the patient with their medications at time of release from the facility to include their pain medication and their blood thinner medication. If the patient is still at the rehab facility at time of the two week follow up appointment, the skilled rehab facility will also need to assist the patient in arranging follow up appointment in our office and any transportation needs. ? ?POST-OPERATIVE OPIOID TAPER INSTRUCTIONS: ?It is important to wean off of your opioid medication as soon as possible. If you do not need pain medication after your surgery it is ok  to stop day one. ?Opioids include: ?Codeine, Hydrocodone(Norco, Vicodin), Oxycodone(Percocet, oxycontin) and hydromorphone amongst others.  ?Long term and even short term use of opiods can cause: ?Increased pain response ?Dependence ?Constipation ?Depression ?Respiratory depression ?And more.  ?Withdrawal symptoms can include ?Flu like symptoms ?Nausea, vomiting ?And more ?Techniques to manage these symptoms ?Hydrate well ?Eat regular healthy meals ?Stay active ?Use relaxation techniques(deep breathing, meditating, yoga) ?Do Not substitute Alcohol to help with tapering ?If you have been on opioids for less than two weeks and do not have pain than it is ok to stop all together.  ?Plan to wean off of opioids ?This plan should start within one week post op of your joint replacement. ?Maintain the same interval or time between taking each dose and first decrease the dose.  ?Cut the total daily intake of opioids by one tablet each day ?Next start to increase the time between doses. ?The last dose that should be eliminated is the evening dose.  ? ? ?MAKE   SURE YOU:  ?Understand these instructions.  ?Will watch your condition.  ?Will get help right away if you are not doing well or get worse. ? ?Pick up stool softner and laxative for home use following surgery while on pain medications. ?Do not remove your dressing. ?The dressing is waterproof--it is OK to take showers. ?Continue to use ice for pain and swelling after surgery. ?Do not use any lotions or creams on the incision until instructed by your surgeon. ?Total Hip Protocol. ? ?

## 2021-07-31 NOTE — Op Note (Signed)
OPERATIVE REPORT  SURGEON: Rod Can, MD   ASSISTANT: Cherlynn June, PA-C.  PREOPERATIVE DIAGNOSIS: Right hip arthritis.   POSTOPERATIVE DIAGNOSIS: Right hip arthritis.   PROCEDURE: Right total hip arthroplasty, anterior approach.   IMPLANTS: DePuy Tri Lock stem, size 4, hi offset. DePuy Pinnacle Cup, size 54 mm. DePuy Altrx liner, size 36 by 54 mm, +4 neutral. DePuy Biolox ceramic head ball, size 36 + 5 mm.  ANESTHESIA:  MAC and Spinal  ESTIMATED BLOOD LOSS:-400 mL    ANTIBIOTICS: 2g Ancef.  DRAINS: None.  COMPLICATIONS: None.   CONDITION: PACU - hemodynamically stable.   BRIEF CLINICAL NOTE: Angela Patrick is a 70 y.o. female with a long-standing history of Right hip arthritis. After failing conservative management, the patient was indicated for total hip arthroplasty. The risks, benefits, and alternatives to the procedure were explained, and the patient elected to proceed.  PROCEDURE IN DETAIL: Surgical site was marked by myself in the pre-op holding area. Once inside the operating room, spinal anesthesia was obtained, and a foley catheter was inserted. The patient was then positioned on the Hana table.  All bony prominences were well padded.  The hip was prepped and draped in the normal sterile surgical fashion.  A time-out was called verifying side and site of surgery. The patient received IV antibiotics within 60 minutes of beginning the procedure.   Bikini incision was created three finger breadths distal to the ASIS, taking care to stay lateral to the medial border of the ASIS. The direct anterior approach to the hip was performed through the Hueter interval.  Lateral femoral circumflex vessels were treated with the Auqumantys. The anterior capsule was exposed and an inverted T capsulotomy was made. The femoral neck cut was made to the level of the templated cut.  A corkscrew was placed into the head and the head was removed.  The femoral head was found to have  eburnated bone. The head was passed to the back table and was measured. Pubofemoral ligament was released off of the calcar, taking care to stay on bone. Superior capsule was released from the greater trochanter, taking care to stay lateral to the posterior border of the femoral neck in order to preserve the short external rotators.   Acetabular exposure was achieved, and the pulvinar and labrum were excised. Sequential reaming of the acetabulum was then performed up to a size 53 mm reamer under direct visulization. A 54 mm cup was then opened and impacted into place at approximately 40 degrees of abduction and 20 degrees of anteversion. The final polyethylene liner was impacted into place and acetabular osteophytes were removed.    I then gained femoral exposure taking care to protect the abductors and greater trochanter.  This was performed using standard external rotation, extension, and adduction.  A cookie cutter was used to enter the femoral canal, and then the femoral canal finder was placed.  Sequential broaching was performed up to a size 4.  Calcar planer was used on the femoral neck remnant.  I placed a hi offset neck and a trial head ball.  The hip was reduced.  Leg lengths and offset were checked fluoroscopically.  The hip was dislocated and trial components were removed.  The final implants were placed, and the hip was reduced.  Fluoroscopy was used to confirm component position and leg lengths.  At 90 degrees of external rotation and full extension, the hip was stable to an anterior directed force.   The wound was copiously irrigated with  Irrisept solution and normal saline using pule lavage.  Marcaine solution was injected into the periarticular soft tissue.  The wound was closed in layers using #1 Stratafix for the fascia, 2-0 Vicryl for the subcutaneous fat, 2-0 Monocryl for the deep dermal layer, 3-0 running Monocryl subcuticular stitch, and Dermabond for the skin.  Once the glue was fully  dried, an Aquacell Ag dressing was applied.  The patient was transported to the recovery room in stable condition.  Sponge, needle, and instrument counts were correct at the end of the case x2.  The patient tolerated the procedure well and there were no known complications.  Please note that a surgical assistant was a medical necessity for this procedure to perform it in a safe and expeditious manner. Assistant was necessary to provide appropriate retraction of vital neurovascular structures, to prevent femoral fracture, and to allow for anatomic placement of the prosthesis.

## 2021-07-31 NOTE — Plan of Care (Signed)
Discussed with patient about plan of care for post-op day 0. ° ° °Will continue to monitor patient.  ° ° °SWhittemore, RN ° °

## 2021-07-31 NOTE — Anesthesia Procedure Notes (Signed)
Spinal  Patient location during procedure: OR Start time: 07/31/2021 8:52 AM End time: 07/31/2021 8:58 AM Reason for block: surgical anesthesia Staffing Performed: resident/CRNA  Anesthesiologist: Leilani Able, MD Resident/CRNA: Lucinda Dell, CRNA Preanesthetic Checklist Completed: patient identified, IV checked, site marked, risks and benefits discussed, surgical consent, monitors and equipment checked, pre-op evaluation and timeout performed Spinal Block Patient position: sitting Prep: ChloraPrep Patient monitoring: cardiac monitor, heart rate, continuous pulse ox and blood pressure Approach: midline Location: L3-4 Injection technique: single-shot Needle Needle type: Pencan  Needle gauge: 24 G Needle length: 10 cm Needle insertion depth: 5 cm Assessment Sensory level: T8 Events: CSF return

## 2021-07-31 NOTE — Progress Notes (Signed)
Physical Therapy Treatment Patient Details Name: Angela Patrick MRN: [id number] DOB: 1951/04/15 Today's Date: 07/31/2021   History of Present Illness Patient is 70 y.o. female s/p Rt THA anterior approach on 07/31/21 with PMH significant for OA, hypothyroidism.    PT Comments    Pt seen additional session to attempt progressing mobility for safe discharge home. Despite encouragement pt declined to attempt OOB due to pain but agreeable to supine exercises to initiate HEP for ROM and circulation. Acute PT will continue to progress pt as able.    Recommendations for follow up therapy are one component of a multi-disciplinary discharge planning process, led by the attending physician.  Recommendations may be updated based on patient status, additional functional criteria and insurance authorization.  Follow Up Recommendations  Follow surgeon's recommendation for DC plan and follow-up therapies     Equipment Recommendations  Rolling walker with 5" wheels    Recommendations for Other Services       Precautions / Restrictions Precautions Precautions: Fall Restrictions Weight Bearing Restrictions: No Other Position/Activity Restrictions: WBAT     Mobility  Bed Mobility               General bed mobility comments: pt declined OOB activity due to pain    Transfers                    Ambulation/Gait                 Stairs             Wheelchair Mobility    Modified Rankin (Stroke Patients Only)       Balance Overall balance assessment: Needs assistance Sitting-balance support: Feet supported Sitting balance-Leahy Scale: Good     Standing balance support: During functional activity;Bilateral upper extremity supported Standing balance-Leahy Scale: Poor                              Cognition Arousal/Alertness: Awake/alert Behavior During Therapy: WFL for tasks assessed/performed Overall Cognitive Status: Within Functional  Limits for tasks assessed                                        Exercises Total Joint Exercises Ankle Circles/Pumps: AROM;Both;20 reps;Supine Quad Sets: AROM;Right;10 reps;Supine Heel Slides: AAROM;Right;10 reps;Supine    General Comments        Pertinent Vitals/Pain Pain Assessment: 0-10 Pain Score: 8  Pain Location: Rt hip Pain Descriptors / Indicators: Aching;Burning Pain Intervention(s): Limited activity within patient's tolerance;Monitored during session;Repositioned;Patient requesting pain meds-RN notified;Ice applied    Home Living Family/patient expects to be discharged to:: Private residence Living Arrangements: Alone Available Help at Discharge: Family Type of Home: House Home Access: Stairs to enter Entrance Stairs-Rails: None Home Layout: One level Home Equipment: Grab bars - tub/shower;Crutches      Prior Function Level of Independence: Independent;Independent with assistive device(s)      Comments: walking with crutch for about 1 year   PT Goals (current goals can now be found in the care plan section) Acute Rehab PT Goals Patient Stated Goal: get home safely PT Goal Formulation: With patient Time For Goal Achievement: 08/07/21 Potential to Achieve Goals: Good Progress towards PT goals: Progressing toward goals    Frequency    7X/week      PT Plan Current plan remains appropriate  Co-evaluation              AM-PAC PT "6 Clicks" Mobility   Outcome Measure  Help needed turning from your back to your side while in a flat bed without using bedrails?: A Little Help needed moving from lying on your back to sitting on the side of a flat bed without using bedrails?: A Little Help needed moving to and from a bed to a chair (including a wheelchair)?: A Little Help needed standing up from a chair using your arms (e.g., wheelchair or bedside chair)?: A Little Help needed to walk in hospital room?: A Lot Help needed climbing  3-5 steps with a railing? : A Lot 6 Click Score: 16    End of Session Equipment Utilized During Treatment: Gait belt Activity Tolerance: Treatment limited secondary to medical complications (Comment) (orthostatic hypotension) Patient left: in bed;with call bell/phone within reach Nurse Communication: Mobility status PT Visit Diagnosis: Muscle weakness (generalized) (M62.81);Difficulty in walking, not elsewhere classified (R26.2)     Time: 6606-3016 PT Time Calculation (min) (ACUTE ONLY): 12 min  Charges:  $Therapeutic Exercise: 8-22 mins                     Wynn Maudlin, DPT Acute Rehabilitation Services Office [phone] Pager [phone]    Anitra Lauth 07/31/2021, 7:44 PM

## 2021-08-01 ENCOUNTER — Encounter (HOSPITAL_COMMUNITY): Payer: Self-pay | Admitting: Orthopedic Surgery

## 2021-08-01 ENCOUNTER — Ambulatory Visit (HOSPITAL_COMMUNITY): Payer: Federal, State, Local not specified - PPO

## 2021-08-01 DIAGNOSIS — I959 Hypotension, unspecified: Secondary | ICD-10-CM | POA: Diagnosis present

## 2021-08-01 DIAGNOSIS — E039 Hypothyroidism, unspecified: Secondary | ICD-10-CM | POA: Diagnosis not present

## 2021-08-01 DIAGNOSIS — I9589 Other hypotension: Secondary | ICD-10-CM | POA: Diagnosis not present

## 2021-08-01 DIAGNOSIS — R739 Hyperglycemia, unspecified: Secondary | ICD-10-CM | POA: Diagnosis present

## 2021-08-01 DIAGNOSIS — M169 Osteoarthritis of hip, unspecified: Secondary | ICD-10-CM | POA: Diagnosis not present

## 2021-08-01 DIAGNOSIS — E876 Hypokalemia: Secondary | ICD-10-CM | POA: Diagnosis not present

## 2021-08-01 DIAGNOSIS — K5900 Constipation, unspecified: Secondary | ICD-10-CM | POA: Diagnosis present

## 2021-08-01 DIAGNOSIS — D649 Anemia, unspecified: Secondary | ICD-10-CM | POA: Diagnosis present

## 2021-08-01 DIAGNOSIS — R519 Headache, unspecified: Secondary | ICD-10-CM | POA: Diagnosis present

## 2021-08-01 DIAGNOSIS — R4182 Altered mental status, unspecified: Secondary | ICD-10-CM | POA: Diagnosis not present

## 2021-08-01 LAB — CBC WITH DIFFERENTIAL/PLATELET
Abs Immature Granulocytes: 0.02 10*3/uL (ref 0.00–0.07)
Basophils Absolute: 0 10*3/uL (ref 0.0–0.1)
Basophils Relative: 0 %
Eosinophils Absolute: 0 10*3/uL (ref 0.0–0.5)
Eosinophils Relative: 0 %
HCT: 31.7 % — ABNORMAL LOW (ref 36.0–46.0)
Hemoglobin: 10.6 g/dL — ABNORMAL LOW (ref 12.0–15.0)
Immature Granulocytes: 0 %
Lymphocytes Relative: 23 %
Lymphs Abs: 1.6 10*3/uL (ref 0.7–4.0)
MCH: 29.3 pg (ref 26.0–34.0)
MCHC: 33.4 g/dL (ref 30.0–36.0)
MCV: 87.6 fL (ref 80.0–100.0)
Monocytes Absolute: 0.7 10*3/uL (ref 0.1–1.0)
Monocytes Relative: 10 %
Neutro Abs: 4.8 10*3/uL (ref 1.7–7.7)
Neutrophils Relative %: 67 %
Platelets: 275 10*3/uL (ref 150–400)
RBC: 3.62 MIL/uL — ABNORMAL LOW (ref 3.87–5.11)
RDW: 12.9 % (ref 11.5–15.5)
WBC: 7.2 10*3/uL (ref 4.0–10.5)
nRBC: 0 % (ref 0.0–0.2)

## 2021-08-01 LAB — URINALYSIS, ROUTINE W REFLEX MICROSCOPIC
Bacteria, UA: NONE SEEN
Bilirubin Urine: NEGATIVE
Glucose, UA: NEGATIVE mg/dL
Ketones, ur: NEGATIVE mg/dL
Nitrite: NEGATIVE
Protein, ur: NEGATIVE mg/dL
Specific Gravity, Urine: 1.004 — ABNORMAL LOW (ref 1.005–1.030)
pH: 6 (ref 5.0–8.0)

## 2021-08-01 LAB — BASIC METABOLIC PANEL
Anion gap: 7 (ref 5–15)
BUN: 7 mg/dL — ABNORMAL LOW (ref 8–23)
CO2: 26 mmol/L (ref 22–32)
Calcium: 8.1 mg/dL — ABNORMAL LOW (ref 8.9–10.3)
Chloride: 105 mmol/L (ref 98–111)
Creatinine, Ser: 0.92 mg/dL (ref 0.44–1.00)
GFR, Estimated: >60 mL/min (ref >60)
Glucose, Bld: 148 mg/dL — ABNORMAL HIGH (ref 70–99)
Potassium: 3.2 mmol/L — ABNORMAL LOW (ref 3.5–5.1)
Sodium: 138 mmol/L (ref 135–145)

## 2021-08-01 LAB — MAGNESIUM: Magnesium: 1.7 mg/dL (ref 1.7–2.4)

## 2021-08-01 LAB — GLUCOSE, CAPILLARY: Glucose-Capillary: 110 mg/dL — ABNORMAL HIGH (ref 70–99)

## 2021-08-01 LAB — RESP PANEL BY RT-PCR (FLU A&B, COVID) ARPGX2
Influenza A by PCR: NEGATIVE
Influenza B by PCR: NEGATIVE
SARS Coronavirus 2 by RT PCR: NEGATIVE

## 2021-08-01 LAB — PHOSPHORUS: Phosphorus: 2.9 mg/dL (ref 2.5–4.6)

## 2021-08-01 LAB — TSH: TSH: 3.125 u[IU]/mL (ref 0.350–4.500)

## 2021-08-01 MED ORDER — DIPHENHYDRAMINE HCL 12.5 MG/5ML PO ELIX: 12.5000 mg | ORAL_SOLUTION | ORAL | Status: DC | PRN

## 2021-08-01 MED ORDER — METOCLOPRAMIDE HCL 5 MG/ML IJ SOLN
5.0000 mg | Freq: Three times a day (TID) | INTRAMUSCULAR | Status: DC | PRN
Start: 2021-08-01 — End: 2021-08-02

## 2021-08-01 MED ORDER — MAGNESIUM SULFATE 2 GM/50ML IV SOLN
2.0000 g | Freq: Once | INTRAVENOUS | Status: DC
  Filled 2021-08-01: qty 50

## 2021-08-01 MED ORDER — MORPHINE SULFATE (PF) 2 MG/ML IV SOLN: 0.5000 mg | INTRAVENOUS | Status: DC | PRN

## 2021-08-01 MED ORDER — KETOROLAC TROMETHAMINE 15 MG/ML IJ SOLN
15.0000 mg | Freq: Once | INTRAMUSCULAR | Status: AC
  Administered 2021-08-01: 15 mg via INTRAVENOUS
  Filled 2021-08-01: qty 1

## 2021-08-01 MED ORDER — ACETAMINOPHEN 325 MG PO TABS: 325.0000 mg | ORAL_TABLET | Freq: Four times a day (QID) | ORAL | Status: DC | PRN

## 2021-08-01 MED ORDER — ALUM & MAG HYDROXIDE-SIMETH 200-200-20 MG/5ML PO SUSP: 30.0000 mL | ORAL | Status: DC | PRN

## 2021-08-01 MED ORDER — SENNA 8.6 MG PO TABS
1.0000 | ORAL_TABLET | Freq: Two times a day (BID) | ORAL | Status: DC
  Administered 2021-08-01 – 2021-08-02 (×3): 8.6 mg via ORAL
  Filled 2021-08-01 (×3): qty 1

## 2021-08-01 MED ORDER — DOCUSATE SODIUM 100 MG PO CAPS
100.0000 mg | ORAL_CAPSULE | Freq: Two times a day (BID) | ORAL | Status: DC
  Administered 2021-08-01 – 2021-08-02 (×3): 100 mg via ORAL
  Filled 2021-08-01 (×3): qty 1

## 2021-08-01 MED ORDER — LEVOTHYROXINE SODIUM 100 MCG PO TABS
100.0000 ug | ORAL_TABLET | Freq: Every day | ORAL | Status: DC
  Administered 2021-08-01 – 2021-08-02 (×2): 100 ug via ORAL
  Filled 2021-08-01 (×2): qty 1

## 2021-08-01 MED ORDER — MENTHOL 3 MG MT LOZG: 1.0000 | LOZENGE | OROMUCOSAL | Status: DC | PRN

## 2021-08-01 MED ORDER — METOCLOPRAMIDE HCL 5 MG PO TABS: 5.0000 mg | ORAL_TABLET | Freq: Three times a day (TID) | ORAL | Status: DC | PRN

## 2021-08-01 MED ORDER — POTASSIUM CHLORIDE CRYS ER 20 MEQ PO TBCR
40.0000 meq | EXTENDED_RELEASE_TABLET | Freq: Once | ORAL | Status: AC
  Administered 2021-08-01: 40 meq via ORAL
  Filled 2021-08-01: qty 2

## 2021-08-01 MED ORDER — POLYETHYLENE GLYCOL 3350 17 G PO PACK
17.0000 g | PACK | Freq: Every day | ORAL | Status: DC | PRN
  Administered 2021-08-01: 17 g via ORAL
  Filled 2021-08-01: qty 1

## 2021-08-01 MED ORDER — SODIUM CHLORIDE 0.9 % IV BOLUS
500.0000 mL | Freq: Once | INTRAVENOUS | Status: AC | PRN
  Administered 2021-08-01: 500 mL via INTRAVENOUS

## 2021-08-01 MED ORDER — PHENOL 1.4 % MT LIQD: 1.0000 | OROMUCOSAL | Status: DC | PRN

## 2021-08-01 MED ORDER — MAGNESIUM HYDROXIDE 400 MG/5ML PO SUSP
15.0000 mL | Freq: Once | ORAL | Status: AC
  Administered 2021-08-01: 15 mL via ORAL
  Filled 2021-08-01: qty 30

## 2021-08-01 MED ORDER — SODIUM CHLORIDE 0.9 % IV SOLN: INTRAVENOUS | Status: DC

## 2021-08-01 MED ORDER — DEXAMETHASONE SODIUM PHOSPHATE 10 MG/ML IJ SOLN
10.0000 mg | Freq: Once | INTRAMUSCULAR | Status: AC
  Administered 2021-08-01: 10 mg via INTRAVENOUS
  Filled 2021-08-01: qty 1

## 2021-08-01 MED ORDER — ASPIRIN 81 MG PO CHEW
81.0000 mg | CHEWABLE_TABLET | Freq: Two times a day (BID) | ORAL | Status: DC
  Administered 2021-08-01 – 2021-08-02 (×3): 81 mg via ORAL
  Filled 2021-08-01 (×3): qty 1

## 2021-08-01 NOTE — Progress Notes (Signed)
Page returned by Sidonie Dickens at [employer].

## 2021-08-01 NOTE — Progress Notes (Signed)
Rapid Response nurse George Ina arrived to the unit.

## 2021-08-01 NOTE — Progress Notes (Addendum)
Patient up to bedside commode and while sitting started tip forward. NT Joni Reining positioned patient back onto the [facility] seat and patient tipped forward. Then patient became nonverbal and trembling slightly, then had a vacant, fixed stare..Rapid response paged.

## 2021-08-01 NOTE — Plan of Care (Signed)
  Problem: Activity: Goal: Risk for activity intolerance will decrease Outcome: Progressing   Problem: Elimination: Goal: Will not experience complications related to bowel motility Outcome: Progressing   Problem: Pain Management: Goal: Pain level will decrease with appropriate interventions Outcome: Progressing   

## 2021-08-01 NOTE — Progress Notes (Addendum)
Rapid Response Event Note   Reason for Call : BP drop, pt poss. lost consciousness while on [facility]   Initial Focused Assessment: On arrival, pt RN x2 and pt NT in the room. Pt A&O x4 eyes open, able to answer all my questions. BP sys 80s. Pt describes having tunnel vision, vision got hazy, but she was able to hear ppl talking around her. Pt RN states she had the 1000 yard stare, wasn't responding to verbal stimuli, then eyes rolled back in her head. Pt RN and NT get pt back in bed then pt became more alert.  Pt describes having similar sensation in PACU  Interventions:  Lowered HOB, 500cc bolus NS, CBC, BMP   Plan of Care:  Watch BP trends. Need consult to hospitalist - This RN believes it would be beneficial to do orthostatic BP series on her.   Vital when RR RN left: BP 136/77 (95), HR 62, 100% RA  Event Summary:  Day RR RN made aware   MD Notified:  Call Time: 0615 Arrival Time: 0620 End Time: 0645  Elliot Cousin, RN

## 2021-08-01 NOTE — Progress Notes (Signed)
Rapid response paged.

## 2021-08-01 NOTE — Consult Note (Signed)
Medical Consultation   Angela Patrick  TKP:[id number]  DOB: [birth year]  DOA: 07/31/2021  PCP: Ileana Ladd, MD   Requesting physician: Samson Frederic, MD  Reason for consultation: Hypotension and transient AMS.    History of Present Illness: Angela Patrick is an 70 y.o. female osteoarthritis, benign liver cysts, enlarged thyroid, hypothyroidism, history of orthostatic hypotension who we are seeing due to hypotension and transient change in mental status earlier this morning.  The patient stated that she has been mildly lightheaded.  This morning she was trying to get out of bed and felt like she got out of bed too fast.  She became more lightheaded, nauseous and mildly diaphoretic.  She describes that she started having tunnel vision, then lost her vision and nearly passed out, but stated that she could still hear some of the staff talking.  Her night shift nurse described that she had a blank stare for a while, her eyes were rolled back and was not responding to verbal stimuli.  They were able to get the patient back in bed and then she became more alert.  They lower HOB.  Rapid response was called and she received a 500 mL NS bolus.  She also fell mildly dyspneic while having a chest pressure which still bothers her when she takes a deep breath.  At the moment, no hypotensive symptoms, but is having headache in her frontal area.  She was recently prescribed a nasal spray for this but has not used that in the past 2 to 3 days.  No nausea at the moment, no vomiting, diarrhea, melena or hematochezia.  She has been constipated for the past 3 days.  No dysuria, frequency or hematuria.  No polyuria, polydipsia, polyphagia or blurred vision.  Review of Systems:  ROS As per HPI otherwise 10 point review of systems negative.   Past Medical History: Past Medical History:  Diagnosis Date   Arthritis    osteoarthritis   Benign liver cyst 2011   Complication of anesthesia 02/2018    Difficult breathing, tremors, palpitations, cold chills from previous dental procedure   Enlarged thyroid    Goiter    ultrasound in 2014 with fine needle aspiration   History of orthostatic hypotension    Hypothyroidism    Past Surgical History: Past Surgical History:  Procedure Laterality Date   ANKLE SURGERY Right    BUNIONECTOMY WITH HAMMERTOE RECONSTRUCTION Left 03/03/2019   Procedure: Left Foot Scarf/Akin Osteotomy/Bunion Correction; Left Second Metatarsal Weil and Hammertoe Correction;  Surgeon: Toni Arthurs, MD;  Location: [facility];  Service: Orthopedics;  Laterality: Left;   HARDWARE REMOVAL Right 03/03/2019   Procedure: Right Ankle Removal Deep Implant Tibia/Fibula ;  Surgeon: Toni Arthurs, MD;  Location: [facility];  Service: Orthopedics;  Laterality: Right;   HERNIA REPAIR     THYROIDECTOMY N/A 08/26/2018   Procedure: TOTAL THYROIDECTOMY;  Surgeon: Darnell Level, MD;  Location: [facility];  Service: General;  Laterality: N/A;   Allergies:   Allergies  Allergen Reactions   Iodine Swelling    Face and arm swelling   Kiwi Extract Hives   Pineapple Hives   Strawberry Extract Hives   Penicillins Rash    Has patient had a PCN reaction causing immediate rash, facial/tongue/throat swelling, SOB or lightheadedness with hypotension: no Has patient had a PCN reaction causing severe rash involving mucus membranes or skin necrosis: no  Has  patient had a PCN reaction that required hospitalization: no Has patient had a PCN reaction occurring within the last 10 years: no If all of the above answers are "NO", then may proceed with Cephalosporin use.    Social History:  reports that she has never smoked. She has never used smokeless tobacco. She reports current alcohol use. She reports that she does not use drugs.  Family History: Family History  Problem Relation Age of Onset   CAD Mother    Goiter Mother    Cancer Mother    AAA (abdominal aortic  aneurysm) Father    Pancreatic cancer Sister    Physical Exam: Vitals:   08/01/21 3810 08/01/21 0628 08/01/21 0628 08/01/21 0703  BP: (!) 84/53 131/77 131/77 127/79  Pulse: 64 61 (!) 59 64  Resp: 17   17  Temp: 98.4 F (36.9 C)   98.2 F (36.8 C)  TempSrc:      SpO2: 100% 100% 100% 100%  Weight:      Height:        Constitutional: Alert and awake, oriented x3, not in any acute distress. Eyes: PERLA, EOMI, irises appear normal, anicteric sclera,  ENMT: external ears and nose appear normal, normal hearing            Lips appears and oropharynx mucosa mildly dry, tongue, posterior pharynx appear normal  Neck: neck appears normal, no masses, normal ROM, no thyromegaly, no JVD  CVS: S1-S2 clear, no murmur rubs or gallops, no LE edema, normal pedal pulses  Respiratory:  clear to auscultation bilaterally, no wheezing, rales or rhonchi. Respiratory effort normal. No accessory muscle use.  Abdomen: soft nontender, nondistended, normal bowel sounds, no hepatosplenomegaly, no hernias  Musculoskeletal: : no cyanosis, clubbing or edema noted bilaterally.  Mild right hip tenderness. Neuro: Cranial nerves II-XII intact, strength, sensation, reflexes Psych: judgement and insight appear normal, stable mood and affect, mental status Skin: no rashes or lesions or ulcers, no induration or nodules.  Data reviewed:  I have personally reviewed following labs and imaging studies Labs:  CBC: Recent Labs  Lab 08/01/21 0634  WBC 7.2  NEUTROABS 4.8  HGB 10.6*  HCT 31.7*  MCV 87.6  PLT 275    Basic Metabolic Panel: Recent Labs  Lab 08/01/21 0634  NA 138  K 3.2*  CL 105  CO2 26  GLUCOSE 148*  BUN 7*  CREATININE 0.92  CALCIUM 8.1*  MG 1.7  PHOS 2.9   GFR Estimated Creatinine Clearance: 56.1 mL/min (by C-G formula based on SCr of 0.92 mg/dL). Liver Function Tests: No results for input(s): AST, ALT, ALKPHOS, BILITOT, PROT, ALBUMIN in the last 168 hours. No results for input(s): LIPASE,  AMYLASE in the last 168 hours. No results for input(s): AMMONIA in the last 168 hours. Coagulation profile No results for input(s): INR, PROTIME in the last 168 hours.  Cardiac Enzymes: No results for input(s): CKTOTAL, CKMB, CKMBINDEX, TROPONINI in the last 168 hours. BNP: Invalid input(s): POCBNP CBG: Recent Labs  Lab 08/01/21 0604  GLUCAP 110*   D-Dimer No results for input(s): DDIMER in the last 72 hours. Hgb A1c No results for input(s): HGBA1C in the last 72 hours. Lipid Profile No results for input(s): CHOL, HDL, LDLCALC, TRIG, CHOLHDL, LDLDIRECT in the last 72 hours. Thyroid function studies Recent Labs    08/01/21 0633  TSH 3.125   Anemia work up No results for input(s): VITAMINB12, FOLATE, FERRITIN, TIBC, IRON, RETICCTPCT in the last 72 hours. Urinalysis  Component Value Date/Time   BILIRUBINUR n 09/25/2016 1420   PROTEINUR n 09/25/2016 1420   UROBILINOGEN negative 09/25/2016 1420   NITRITE n 09/25/2016 1420   LEUKOCYTESUR Negative 09/25/2016 1420   Sepsis Labs Invalid input(s): PROCALCITONIN,  WBC,  LACTICIDVEN Microbiology Recent Results (from the past 240 hour(s))  Surgical pcr screen     Status: None   Collection Time: 07/22/21  1:34 PM   Specimen: Nasal Mucosa; Nasal Swab  Result Value Ref Range Status   MRSA, PCR NEGATIVE NEGATIVE Final   Staphylococcus aureus NEGATIVE NEGATIVE Final    Comment: (NOTE) The Xpert SA Assay (FDA approved for NASAL specimens in patients 57 years of age and older), is one component of a comprehensive surveillance program. It is not intended to diagnose infection nor to guide or monitor treatment. Performed at [facility], [address] [place], [place] [address]    Inpatient Medications:   Scheduled Meds:  aspirin  81 mg Oral BID   [START ON 08/02/2021] dexamethasone (DECADRON) injection  10 mg Intravenous Once   docusate sodium  100 mg Oral BID   ketorolac  7.5 mg Intravenous Q6H    levothyroxine  100 mcg Oral QAC breakfast   potassium chloride  40 mEq Oral Once   senna  1 tablet Oral BID   Continuous Infusions:  sodium chloride 150 mL/hr at 08/01/21 6387   magnesium sulfate bolus IVPB     methocarbamol (ROBAXIN) IV 500 mg (07/31/21 1509)   Radiological Exams on Admission: CT HEAD WO CONTRAST ( )  Result Date: 08/01/2021 CLINICAL DATA:  Altered mental status, headache, near syncope EXAM: CT HEAD WITHOUT CONTRAST TECHNIQUE: Contiguous axial images were obtained from the base of the skull through the vertex without intravenous contrast. COMPARISON:  CT head 12268 FINDINGS: Brain: There is no acute intracranial hemorrhage, extra-axial fluid collection, or acute infarct. Parenchymal volume is normal.  The ventricles are normal in size. There is no mass lesion.  There is no midline shift. Vascular: No hyperdense vessel or unexpected calcification. Skull: Normal. Negative for fracture or focal lesion. Sinuses/Orbits: The imaged paranasal sinuses are clear. The globes and orbits are unremarkable. Other: None. IMPRESSION: No acute intracranial pathology. Electronically Signed   By: Lesia Hausen M.D.   On: 08/01/2021 08:51   DG Pelvis Portable  Result Date: 07/31/2021 CLINICAL DATA:  Status post total right hip arthroplasty. EXAM: PORTABLE PELVIS 1-2 VIEWS COMPARISON:  04/18/2020 FINDINGS: The right total hip arthroplasty components are well seated. No complicating features are identified. The pubic symphysis and SI joints are intact.  No pelvic fractures. Stable advanced degenerative changes involving the left hip. IMPRESSION: Well seated right total hip arthroplasty components without complicating features. Electronically Signed   By: Rudie Meyer M.D.   On: 07/31/2021 11:38   DG C-Arm 1-60 Min-No Report  Result Date: 07/31/2021 Fluoroscopy was utilized by the requesting physician.  No radiographic interpretation.   DG C-Arm 1-60 Min-No Report  Result Date:  07/31/2021 Fluoroscopy was utilized by the requesting physician.  No radiographic interpretation.   DG HIP UNILAT WITH PELVIS 1V RIGHT  Result Date: 07/31/2021 CLINICAL DATA:  Postop right hip replacement EXAM: DG HIP (WITH OR WITHOUT PELVIS) 1V RIGHT COMPARISON:  None. FINDINGS: Fluoroscopic images were obtained intraoperatively and submitted for post operative interpretation. Right total hip arthroplasty with hardware in expected position, 4 images were obtained with 13 seconds of fluoroscopy time. Please see the performing provider's procedural report for further detail. IMPRESSION: See above. Electronically  Signed   By: Allegra Lai M.D.   On: 07/31/2021 11:05    Impression/Recommendations Principal Problem:   Hypotension In the setting of: Medications side effects. Acute blood loss anemia. Pulse was low normal or bradycardic. Decrease po intake in the past 72 hours. She has history of orthostatic hypotension. Continue IV fluids. Monitor H&H. Fall precautions. Pt. asked to get OOB with staff help  Active Problems:   Hypokalemia Replacement ordered. Follow-up potassium level. Magnesium has been supplemented.    Constipation MOM 30 mL p.o. x1.    Normocytic anemia Monitor hematocrit and hemoglobin.    Headache Frontal headache due to congestion. She uses a nasal spray to home. Congestion got worse with hypotension. CT head was negative. Trial of Toradol 15 mg IVP x1.    Hyperglycemia Recheck fasting glucose in the morning.    Osteoarthritis of right hip   S/P total hip arthroplasty Continue postop care.    Hypothyroidism Continue levothyroxine 100 mcg p.o. daily. Check TSH level.   Thank you for this consultation.  Our [facility] hospitalist team will follow the patient with you.  Time Spent: About 55 minutes were spent during the process of this consult.  Bobette Mo M.D. [facility] 08/01/2021, 8:59 AM  This document was prepared using Dragon  voice recognition software may contain some unintended transcription errors.

## 2021-08-01 NOTE — Progress Notes (Signed)
    Subjective:  Patient reports pain as mild to moderate.  Denies N/V/CP/SOB. Patient had a mental status change at approximately 6AM today while using BSC. Patient currently feels "normal". Patient did say that she had history of seizure in her teenage years but has had no activity in over 50 years. Her son is epileptic.  Objective:   VITALS:   Vitals:   08/01/21 0614 08/01/21 0628 08/01/21 0628 08/01/21 0703  BP: (!) 84/53 131/77 131/77 127/79  Pulse: 64 61 (!) 59 64  Resp: 17   17  Temp: 98.4 F (36.9 C)   98.2 F (36.8 C)  TempSrc:      SpO2: 100% 100% 100% 100%  Weight:      Height:        NAD ABD soft Neurovascular intact Sensation intact distally Intact pulses distally Dorsiflexion/Plantar flexion intact Incision: dressing C/D/I   Lab Results  Component Value Date   WBC 7.2 08/01/2021   HGB 10.6 (L) 08/01/2021   HCT 31.7 (L) 08/01/2021   MCV 87.6 08/01/2021   PLT 275 08/01/2021   BMET    Component Value Date/Time   NA 138 08/01/2021 0634   K 3.2 (L) 08/01/2021 0634   CL 105 08/01/2021 0634   CO2 26 08/01/2021 0634   GLUCOSE 148 (H) 08/01/2021 0634   BUN 7 (L) 08/01/2021 0634   CREATININE 0.92 08/01/2021 0634   CALCIUM 8.1 (L) 08/01/2021 0634   GFRNONAA >60 08/01/2021 1275     Assessment/Plan: 1 Day Post-Op   Principal Problem:   Osteoarthritis of right hip Active Problems:   S/P total hip arthroplasty   Hypothyroidism   Hypokalemia   Normocytic anemia   Hyperglycemia   WBAT with walker DVT ppx: Aspirin, SCDs, TEDS PO pain control PT/OT Mental status change: Hospitalist team consulted. Requested UA and head CT which were ordered. Appreciate input Dispo: Planning     Darrick Grinder 08/01/2021, 7:52 AM  [facility] is now [employer] [address], [place], [place] [address] Phone: [phone] [email or web address] Facebook  [employer]

## 2021-08-01 NOTE — Progress Notes (Signed)
TRH called for episode of MS change this am while on BSC. Rapid Response was called by RN at that time. Dr. Robb Matar will be seeing patient. He requested a CT head and U/A, which I have ordered.

## 2021-08-01 NOTE — TOC Transition Note (Signed)
Transition of Care [facility]) - CM/SW Discharge Note   Patient Details  Name: Angela Patrick MRN: [id number] Date of Birth: Jul 01, 1951  Transition of Care [facility] CM/SW Contact:  Lennart Pall, LCSW Phone Number: 08/01/2021, 10:22 AM   Clinical Narrative:     Met with pt and confirming she has received rw and 3n1 to room via [facility].  Plan for HEP.  No further TOC needs.  Final next level of care: Home/Self Care Barriers to Discharge: No Barriers Identified   Patient Goals and CMS Choice Patient states their goals for this hospitalization and ongoing recovery are:: return home      Discharge Placement                       Discharge Plan and Services                DME Arranged: 3-N-1, Walker rolling DME Agency: [employer] Date DME Agency Contacted:  (prearranged via MD office)                Social Determinants of Health (SDOH) Interventions     Readmission Risk Interventions No flowsheet data found.

## 2021-08-01 NOTE — Progress Notes (Signed)
Physical Therapy Treatment Patient Details Name: Angela Patrick MRN: [id number] DOB: [birth year] Today's Date: 08/01/2021   History of Present Illness Patient is 70 y.o. female s/p Rt THA anterior approach on 07/31/21 with PMH significant for OA, hypothyroidism.    PT Comments    Pt very cooperative and progressing with mobility but continues pain limited.     Recommendations for follow up therapy are one component of a multi-disciplinary discharge planning process, led by the attending physician.  Recommendations may be updated based on patient status, additional functional criteria and insurance authorization.  Follow Up Recommendations  Follow surgeon's recommendation for DC plan and follow-up therapies     Equipment Recommendations  Rolling walker with 5" wheels    Recommendations for Other Services       Precautions / Restrictions Precautions Precautions: Fall Restrictions Weight Bearing Restrictions: No Other Position/Activity Restrictions: WBAT     Mobility  Bed Mobility Overal bed mobility: Needs Assistance Bed Mobility: Supine to Sit     Supine to sit: Min assist;HOB elevated     General bed mobility comments: cues for sequence and use of L LE to self assist    Transfers Overall transfer level: Needs assistance Equipment used: Rolling walker (2 wheeled) Transfers: Sit to/from [employer] Sit to Stand: Min assist Stand pivot transfers: Min assist       General transfer comment: cues for LE management and use of UEs to self assist.  Physical assist to bring wt up and fwd and to balance in initial standing.  Stand/pvt bed to [facility]  Ambulation/Gait Ambulation/Gait assistance: Min assist Gait Distance (Feet): 25 Feet Assistive device: Rolling walker (2 wheeled) Gait Pattern/deviations: Step-to pattern;Decreased step length - right;Decreased step length - left;Shuffle;Trunk flexed Gait velocity: decr   General Gait Details: cues for  sequence, posture, position from RW; [employer]   Stairs             Wheelchair Mobility    Modified Rankin (Stroke Patients Only)       Balance Overall balance assessment: Needs assistance Sitting-balance support: Feet supported Sitting balance-Leahy Scale: Good     Standing balance support: No upper extremity supported Standing balance-Leahy Scale: Fair                              Cognition Arousal/Alertness: Awake/alert Behavior During Therapy: WFL for tasks assessed/performed Overall Cognitive Status: Within Functional Limits for tasks assessed                                        Exercises Total Joint Exercises Ankle Circles/Pumps: AROM;Both;20 reps;Supine Quad Sets: AROM;Right;10 reps;Supine Heel Slides: AAROM;Right;Supine;15 reps Hip ABduction/ADduction: AAROM;Right;15 reps;Supine    General Comments        Pertinent Vitals/Pain Pain Assessment: 0-10 Pain Score: 8  Pain Location: Rt hip Pain Descriptors / Indicators: Aching;Burning Pain Intervention(s): Limited activity within patient's tolerance;Monitored during session;Patient requesting pain meds-RN notified;RN gave pain meds during session;Premedicated before session;Ice applied    Home Living                      Prior Function            PT Goals (current goals can now be found in the care plan section) Acute Rehab PT Goals Patient Stated Goal: regain IND PT Goal Formulation: With  patient Time For Goal Achievement: 08/07/21 Potential to Achieve Goals: Good Progress towards PT goals: Progressing toward goals    Frequency    7X/week      PT Plan Current plan remains appropriate    Co-evaluation              AM-PAC PT "6 Clicks" Mobility   Outcome Measure  Help needed turning from your back to your side while in a flat bed without using bedrails?: A Little Help needed moving from lying on your back to sitting on the side  of a flat bed without using bedrails?: A Little Help needed moving to and from a bed to a chair (including a wheelchair)?: A Little Help needed standing up from a chair using your arms (e.g., wheelchair or bedside chair)?: A Little Help needed to walk in hospital room?: A Little Help needed climbing 3-5 steps with a railing? : A Lot 6 Click Score: 17    End of Session Equipment Utilized During Treatment: Gait belt Activity Tolerance: Treatment limited secondary to medical complications (Comment) Patient left: in bed;with call bell/phone within reach Nurse Communication: Mobility status PT Visit Diagnosis: Muscle weakness (generalized) (M62.81);Difficulty in walking, not elsewhere classified (R26.2)     Time: 3235-5732 PT Time Calculation (min) (ACUTE ONLY): 31 min  Charges:  $Gait Training: 8-22 mins $Therapeutic Exercise: 8-22 mins                     Mauro Kaufmann PT Acute Rehabilitation Services Pager [phone] Office [phone]    Angela Patrick 08/01/2021, 12:20 PM

## 2021-08-01 NOTE — Progress Notes (Signed)
Paged [facility]. 

## 2021-08-01 NOTE — Progress Notes (Signed)
Patient getting up to bedside commode with no difficulty. Lightheadedness and nausea resolved.

## 2021-08-01 NOTE — Progress Notes (Signed)
Physical Therapy Treatment Patient Details Name: Angela Patrick MRN: [id number] DOB: [birth year] Today's Date: 08/01/2021   History of Present Illness Patient is 70 y.o. female s/p Rt THA anterior approach on 07/31/21 with PMH significant for OA, hypothyroidism.    PT Comments    Pt continues very motivated and progressing well with mobility - pt up to bathroom for toileting and hygiene at sink and then to ambulate increased distance in hall.  Pt hopeful for dc home tomorrow.   Recommendations for follow up therapy are one component of a multi-disciplinary discharge planning process, led by the attending physician.  Recommendations may be updated based on patient status, additional functional criteria and insurance authorization.  Follow Up Recommendations  Follow surgeon's recommendation for DC plan and follow-up therapies     Equipment Recommendations  Rolling walker with 5" wheels    Recommendations for Other Services       Precautions / Restrictions Precautions Precautions: Fall Restrictions Weight Bearing Restrictions: No Other Position/Activity Restrictions: WBAT     Mobility  Bed Mobility               General bed mobility comments: Pt up in chair and requests back to same    Transfers Overall transfer level: Needs assistance Equipment used: Rolling walker (2 wheeled) Transfers: Sit to/from Stand Sit to Stand: Min guard         General transfer comment: cues for LE management and use of UEs to self assist.  Physical assist to bring wt up and fwd and to balance in initial standing.  Stand/pvt bed to [facility]  Ambulation/Gait Ambulation/Gait assistance: Min guard Gait Distance (Feet): 85 Feet (and additional 25' into bathroom) Assistive device: Rolling walker (2 wheeled) Gait Pattern/deviations: Step-to pattern;Decreased step length - right;Decreased step length - left;Shuffle;Trunk flexed Gait velocity: decr   General Gait Details: cues for sequence,  posture, position from RW; distance pain ltd   Stairs             Wheelchair Mobility    Modified Rankin (Stroke Patients Only)       Balance Overall balance assessment: Needs assistance Sitting-balance support: Feet supported Sitting balance-Leahy Scale: Good     Standing balance support: No upper extremity supported Standing balance-Leahy Scale: Fair                              Cognition Arousal/Alertness: Awake/alert Behavior During Therapy: WFL for tasks assessed/performed Overall Cognitive Status: Within Functional Limits for tasks assessed                                        Exercises      General Comments        Pertinent Vitals/Pain Pain Assessment: 0-10 Pain Score: 7  Pain Location: Rt hip Pain Descriptors / Indicators: Aching;Burning Pain Intervention(s): Limited activity within patient's tolerance;Monitored during session;Premedicated before session;Ice applied    Home Living                      Prior Function            PT Goals (current goals can now be found in the care plan section) Acute Rehab PT Goals Patient Stated Goal: regain IND PT Goal Formulation: With patient Time For Goal Achievement: 08/07/21 Potential to Achieve Goals: Good Progress towards PT goals: Progressing  toward goals    Frequency    7X/week      PT Plan Current plan remains appropriate    Co-evaluation              AM-PAC PT "6 Clicks" Mobility   Outcome Measure  Help needed turning from your back to your side while in a flat bed without using bedrails?: A Little Help needed moving from lying on your back to sitting on the side of a flat bed without using bedrails?: A Little Help needed moving to and from a bed to a chair (including a wheelchair)?: A Little Help needed standing up from a chair using your arms (e.g., wheelchair or bedside chair)?: A Little Help needed to walk in hospital room?: A  Little Help needed climbing 3-5 steps with a railing? : A Lot 6 Click Score: 17    End of Session Equipment Utilized During Treatment: Gait belt Activity Tolerance: Treatment limited secondary to medical complications (Comment) Patient left: in bed;with call bell/phone within reach Nurse Communication: Mobility status PT Visit Diagnosis: Muscle weakness (generalized) (M62.81);Difficulty in walking, not elsewhere classified (R26.2)     Time: 1550-1616 PT Time Calculation (min) (ACUTE ONLY): 26 min  Charges:  $Gait Training: 8-22 mins $Therapeutic Activity: 8-22 mins                     Mauro Kaufmann PT Acute Rehabilitation Services Pager [phone] Office [phone]    [facility] 08/01/2021, 4:33 PM

## 2021-08-01 NOTE — Progress Notes (Signed)
Angela Patrick arrived to the room.

## 2021-08-02 DIAGNOSIS — S72041A Displaced fracture of base of neck of right femur, initial encounter for closed fracture: Secondary | ICD-10-CM | POA: Diagnosis not present

## 2021-08-02 DIAGNOSIS — M169 Osteoarthritis of hip, unspecified: Secondary | ICD-10-CM | POA: Diagnosis not present

## 2021-08-02 LAB — BASIC METABOLIC PANEL
Anion gap: 6 (ref 5–15)
BUN: 6 mg/dL — ABNORMAL LOW (ref 8–23)
CO2: 25 mmol/L (ref 22–32)
Calcium: 8.3 mg/dL — ABNORMAL LOW (ref 8.9–10.3)
Chloride: 104 mmol/L (ref 98–111)
Creatinine, Ser: 0.78 mg/dL (ref 0.44–1.00)
GFR, Estimated: >60 mL/min (ref >60)
Glucose, Bld: 128 mg/dL — ABNORMAL HIGH (ref 70–99)
Potassium: 3.8 mmol/L (ref 3.5–5.1)
Sodium: 135 mmol/L (ref 135–145)

## 2021-08-02 LAB — CBC
HCT: 33.5 % — ABNORMAL LOW (ref 36.0–46.0)
Hemoglobin: 11 g/dL — ABNORMAL LOW (ref 12.0–15.0)
MCH: 29 pg (ref 26.0–34.0)
MCHC: 32.8 g/dL (ref 30.0–36.0)
MCV: 88.4 fL (ref 80.0–100.0)
Platelets: 250 10*3/uL (ref 150–400)
RBC: 3.79 MIL/uL — ABNORMAL LOW (ref 3.87–5.11)
RDW: 13.2 % (ref 11.5–15.5)
WBC: 7.5 10*3/uL (ref 4.0–10.5)
nRBC: 0 % (ref 0.0–0.2)

## 2021-08-02 NOTE — Progress Notes (Signed)
Physical Therapy Treatment Patient Details Name: Angela Patrick MRN: [id number] DOB: 03/31/1951 Today's Date: 08/02/2021   History of Present Illness Patient is 70 y.o. female s/p Rt THA anterior approach on 07/31/21 with PMH significant for OA, hypothyroidism.    PT Comments    Pt motivated and progressing well with mobility.  Pt up to ambulate increased distance in hall, negotiated step, reviewed car transfers, and performed HEP with written instruction provided and reviewed.  Pt eager for dc home.   Recommendations for follow up therapy are one component of a multi-disciplinary discharge planning process, led by the attending physician.  Recommendations may be updated based on patient status, additional functional criteria and insurance authorization.  Follow Up Recommendations  Follow surgeon's recommendation for DC plan and follow-up therapies     Equipment Recommendations  Rolling walker with 5" wheels    Recommendations for Other Services       Precautions / Restrictions Precautions Precautions: Fall Restrictions Weight Bearing Restrictions: No Other Position/Activity Restrictions: WBAT     Mobility  Bed Mobility               General bed mobility comments: Pt requests back to chair.  Pt reports assisting self in/out bed overnight with min difficulty    Transfers Overall transfer level: Needs assistance Equipment used: Rolling walker (2 wheeled) Transfers: Sit to/from Stand Sit to Stand: Supervision         General transfer comment: Pt self cues for technique  Ambulation/Gait Ambulation/Gait assistance: Min guard;Supervision Gait Distance (Feet): 150 Feet Assistive device: Rolling walker (2 wheeled) Gait Pattern/deviations: Step-to pattern;Decreased step length - right;Decreased step length - left;Shuffle;Trunk flexed Gait velocity: decr   General Gait Details: Pt self cues for sequence, posture and position from RW   Stairs Stairs: Yes Stairs  assistance: Min guard Stair Management: No rails;Forwards;With walker;Step to pattern Number of Stairs: 2 General stair comments: single step twice with cues for sequence and RW placement   Wheelchair Mobility    Modified Rankin (Stroke Patients Only)       Balance Overall balance assessment: Needs assistance Sitting-balance support: Feet supported Sitting balance-Leahy Scale: Good     Standing balance support: No upper extremity supported Standing balance-Leahy Scale: Fair                              Cognition Arousal/Alertness: Awake/alert Behavior During Therapy: WFL for tasks assessed/performed Overall Cognitive Status: Within Functional Limits for tasks assessed                                        Exercises Total Joint Exercises Ankle Circles/Pumps: AROM;Both;20 reps;Supine Quad Sets: AROM;Right;10 reps;Supine Heel Slides: AAROM;Right;Supine;15 reps Hip ABduction/ADduction: AAROM;Right;15 reps;Supine Long Arc Quad: AROM;AAROM;Right;10 reps;Seated    General Comments        Pertinent Vitals/Pain Pain Assessment: 0-10 Pain Score: 6  Pain Location: Rt hip Pain Descriptors / Indicators: Aching;Burning Pain Intervention(s): Limited activity within patient's tolerance;Monitored during session;Premedicated before session;Ice applied    Home Living                      Prior Function            PT Goals (current goals can now be found in the care plan section) Acute Rehab PT Goals Patient Stated Goal: regain IND PT Goal  Formulation: With patient Time For Goal Achievement: 08/07/21 Potential to Achieve Goals: Good Progress towards PT goals: Progressing toward goals    Frequency    7X/week      PT Plan Current plan remains appropriate    Co-evaluation              AM-PAC PT "6 Clicks" Mobility   Outcome Measure  Help needed turning from your back to your side while in a flat bed without using  bedrails?: A Little Help needed moving from lying on your back to sitting on the side of a flat bed without using bedrails?: A Little Help needed moving to and from a bed to a chair (including a wheelchair)?: A Little Help needed standing up from a chair using your arms (e.g., wheelchair or bedside chair)?: A Little Help needed to walk in hospital room?: A Little Help needed climbing 3-5 steps with a railing? : A Little 6 Click Score: 18    End of Session Equipment Utilized During Treatment: Gait belt Activity Tolerance: Treatment limited secondary to medical complications (Comment) Patient left: with call bell/phone within reach;in chair Nurse Communication: Mobility status PT Visit Diagnosis: Muscle weakness (generalized) (M62.81);Difficulty in walking, not elsewhere classified (R26.2)     Time: 9518-8416 PT Time Calculation (min) (ACUTE ONLY): 34 min  Charges:  $Gait Training: 8-22 mins $Therapeutic Exercise: 8-22 mins                     Mauro Kaufmann PT Acute Rehabilitation Services Pager [phone] Office [phone]    Angela Patrick 08/02/2021, 9:44 AM

## 2021-08-02 NOTE — Plan of Care (Signed)
Patient dc'd, all care plans met  

## 2021-08-02 NOTE — Plan of Care (Signed)
°  Problem: Activity: °Goal: Ability to tolerate increased activity will improve °Outcome: Progressing °  °Problem: Pain Management: °Goal: Pain level will decrease with appropriate interventions °Outcome: Progressing °  °Problem: Activity: °Goal: Risk for activity intolerance will decrease °Outcome: Progressing °  °

## 2021-08-02 NOTE — Discharge Summary (Signed)
Physician Discharge Summary  Patient ID: Angela Patrick MRN: [id number] DOB/AGE: 12/29/1950 70 y.o.  Admit date: 07/31/2021 Discharge date: 08/02/2021  Admission Diagnoses:  Osteoarthritis of right hip  Discharge Diagnoses:  Principal Problem:   Osteoarthritis of right hip Active Problems:   S/P total hip arthroplasty   Hypothyroidism   Hypokalemia   Normocytic anemia   Hyperglycemia   Hypotension   Constipation   Headache   Past Medical History:  Diagnosis Date   Arthritis    osteoarthritis   Benign liver cyst 2011   Complication of anesthesia 02/2018   Difficult breathing, tremors, palpitations, cold chills from previous dental procedure   Enlarged thyroid    Goiter    ultrasound in 2014 with fine needle aspiration   History of orthostatic hypotension    Hypothyroidism     Surgeries: Procedure(s): TOTAL HIP ARTHROPLASTY ANTERIOR APPROACH on 07/31/2021   Consultants (if any): Treatment Team:  Jerald Kief, MD  Discharged Condition: Improved  Hospital Course: Angela Patrick is an 70 y.o. female who was admitted 07/31/2021 with a diagnosis of Osteoarthritis of right hip and went to the operating room on 07/31/2021 and underwent the above named procedures.    She was given perioperative antibiotics:  Anti-infectives (From admission, onward)    Start     Dose/Rate Route Frequency Ordered Stop   07/31/21 1752  ceFAZolin (ANCEF) 2-4 GM/100ML-% IVPB       Note to Pharmacy: Melina Fiddler  : cabinet override      07/31/21 1752 08/01/21 0559   07/31/21 1500  ceFAZolin (ANCEF) IVPB 2g/100 mL premix        2 g 200 mL/hr over 30 Minutes Intravenous Every 6 hours 07/31/21 1050 08/01/21 0109   07/31/21 0615  ceFAZolin (ANCEF) IVPB 2g/100 mL premix        2 g 200 mL/hr over 30 Minutes Intravenous On call to O.R. 07/31/21 9629 07/31/21 0900     .  She was given sequential compression devices, early ambulation, and aspirin for DVT prophylaxis. The patient did  experience once transient episode of altered mental status the night of surgery but was cleared for discharge with no further episodes  She benefited maximally from the hospital stay.  Recent vital signs:  Vitals:   08/01/21 2221 08/02/21 0545  BP: 116/79 108/76  Pulse: 66 69  Resp: 17 17  Temp: 98.5 F (36.9 C) 98.4 F (36.9 C)  SpO2: 100% 98%    Recent laboratory studies:  Lab Results  Component Value Date   HGB 11.0 (L) 08/02/2021   HGB 10.6 (L) 08/01/2021   HGB 13.2 07/22/2021   Lab Results  Component Value Date   WBC 7.5 08/02/2021   PLT 250 08/02/2021   Lab Results  Component Value Date   INR 1.0 07/22/2021   Lab Results  Component Value Date   NA 135 08/02/2021   K 3.8 08/02/2021   CL 104 08/02/2021   CO2 25 08/02/2021   BUN 6 (L) 08/02/2021   CREATININE 0.78 08/02/2021   GLUCOSE 128 (H) 08/02/2021     WEIGHT BEARING   Weight bearing as tolerated with assist device (walker, cane, etc) as directed, use it as long as suggested by your surgeon or therapist, typically at least 4-6 weeks.   EXERCISES  Results after joint replacement surgery are often greatly improved when you follow the exercise, range of motion and muscle strengthening exercises prescribed by your doctor. Safety measures are also important to protect the joint  from further injury. Any time any of these exercises cause you to have increased pain or swelling, decrease what you are doing until you are comfortable again and then slowly increase them. If you have problems or questions, call your caregiver or physical therapist for advice.   Rehabilitation is important following a joint replacement. After just a few days of immobilization, the muscles of the leg can become weakened and shrink (atrophy).  These exercises are designed to build up the tone and strength of the thigh and leg muscles and to improve motion. Often times heat used for twenty to thirty minutes before working out will loosen up  your tissues and help with improving the range of motion but do not use heat for the first two weeks following surgery (sometimes heat can increase post-operative swelling).   These exercises can be done on a training (exercise) mat, on the floor, on a table or on a bed. Use whatever works the best and is most comfortable for you.    Use music or television while you are exercising so that the exercises are a pleasant break in your day. This will make your life better with the exercises acting as a break in your routine that you can look forward to.   Perform all exercises about fifteen times, three times per day or as directed.  You should exercise both the operative leg and the other leg as well.  Exercises include:   Quad Sets - Tighten up the muscle on the front of the thigh (Quad) and hold for 5-10 seconds.   Straight Leg Raises - With your knee straight (if you were given a brace, keep it on), lift the leg to 60 degrees, hold for 3 seconds, and slowly lower the leg.  Perform this exercise against resistance later as your leg gets stronger.  Leg Slides: Lying on your back, slowly slide your foot toward your buttocks, bending your knee up off the floor (only go as far as is comfortable). Then slowly slide your foot back down until your leg is flat on the floor again.  Angel Wings: Lying on your back spread your legs to the side as far apart as you can without causing discomfort.  Hamstring Strength:  Lying on your back, push your heel against the floor with your leg straight by tightening up the muscles of your buttocks.  Repeat, but this time bend your knee to a comfortable angle, and push your heel against the floor.  You may put a pillow under the heel to make it more comfortable if necessary.   A rehabilitation program following joint replacement surgery can speed recovery and prevent re-injury in the future due to weakened muscles. Contact your doctor or a physical therapist for more information  on knee rehabilitation.    CONSTIPATION  Constipation is defined medically as fewer than three stools per week and severe constipation as less than one stool per week.  Even if you have a regular bowel pattern at home, your normal regimen is likely to be disrupted due to multiple reasons following surgery.  Combination of anesthesia, postoperative narcotics, change in appetite and fluid intake all can affect your bowels.   YOU MUST use at least one of the following options; they are listed in order of increasing strength to get the job done.  They are all available over the counter, and you may need to use some, POSSIBLY even all of these options:    Drink plenty of fluids (prune  juice may be helpful) and high fiber foods Colace 100 mg by mouth twice a day  Senokot for constipation as directed and as needed Dulcolax (bisacodyl), take with full glass of water  Miralax (polyethylene glycol) once or twice a day as needed.  If you have tried all these things and are unable to have a bowel movement in the first 3-4 days after surgery call either your surgeon or your primary doctor.    If you experience loose stools or diarrhea, hold the medications until you stool forms back up.  If your symptoms do not get better within 1 week or if they get worse, check with your doctor.  If you experience "the worst abdominal pain ever" or develop nausea or vomiting, please contact the office immediately for further recommendations for treatment.  POST-OPERATIVE OPIOID TAPER INSTRUCTIONS: It is important to wean off of your opioid medication as soon as possible. If you do not need pain medication after your surgery it is ok to stop day one. Opioids include: Codeine, Hydrocodone(Norco, Vicodin), Oxycodone(Percocet, oxycontin) and hydromorphone amongst others.  Long term and even short term use of opiods can cause: Increased pain response Dependence Constipation Depression Respiratory depression And more.   Withdrawal symptoms can include Flu like symptoms Nausea, vomiting And more Techniques to manage these symptoms Hydrate well Eat regular healthy meals Stay active Use relaxation techniques(deep breathing, meditating, yoga) Do Not substitute Alcohol to help with tapering If you have been on opioids for less than two weeks and do not have pain than it is ok to stop all together.  Plan to wean off of opioids This plan should start within one week post op of your joint replacement. Maintain the same interval or time between taking each dose and first decrease the dose.  Cut the total daily intake of opioids by one tablet each day Next start to increase the time between doses. The last dose that should be eliminated is the evening dose.    Dental Antibiotics:  In most cases prophylactic antibiotics for Dental procdeures after total joint surgery are not necessary.  Exceptions are as follows:  1. History of prior total joint infection  2. Severely immunocompromised (Organ Transplant, cancer chemotherapy, Rheumatoid biologic meds such as Humera)  3. Poorly controlled diabetes (A1C &gt; 8.0, blood glucose over 200)  If you have one of these conditions, contact your surgeon for an antibiotic prescription, prior to your dental procedure.  ITCHING:  If you experience itching with your medications, try taking only a single pain pill, or even half a pain pill at a time.  You can also use Benadryl over the counter for itching or also to help with sleep.   TED HOSE STOCKINGS:  Use stockings on both legs until for at least 2 weeks or as directed by physician office. They may be removed at night for sleeping.  MEDICATIONS:  See your medication summary on the "After Visit Summary" that nursing will review with you.  You may have some home medications which will be placed on hold until you complete the course of blood thinner medication.  It is important for you to complete the blood thinner  medication as prescribed.  PRECAUTIONS:  If you experience chest pain or shortness of breath - call 911 immediately for transfer to the hospital emergency department.   If you develop a fever greater that 101 F, purulent drainage from wound, increased redness or drainage from wound, foul odor from the wound/dressing, or calf pain -  CONTACT YOUR SURGEON.                                                   FOLLOW-UP APPOINTMENTS:  If you do not already have a post-op appointment, please call the office for an appointment to be seen by your surgeon.  Guidelines for how soon to be seen are listed in your "After Visit Summary", but are typically between 1-4 weeks after surgery.  OTHER INSTRUCTIONS:   Knee Replacement:  Do not place pillow under knee, focus on keeping the knee straight while resting. CPM instructions: 0-90 degrees, 2 hours in the morning, 2 hours in the afternoon, and 2 hours in the evening. Place foam block, curve side up under heel at all times except when in CPM or when walking.  DO NOT modify, tear, cut, or change the foam block in any way.   MAKE SURE YOU:  Understand these instructions.  Get help right away if you are not doing well or get worse.    Thank you for letting us be a part of your medical care team.  It is a privilege we respect greatly.  We hope these instructions will help you stay on track for a fast and full recovery!   Diagnostic Studies: CT HEAD WO CONTRAST ( )  Result Date: 08/01/2021 CLINICAL DATA:  Altered mental status, headache, near syncope EXAM: CT HEAD WITHOUT CONTRAST TECHNIQUE: Contiguous axial images were obtained from the base of the skull through the vertex without intravenous contrast. COMPARISON:  CT head 12268 FINDINGS: Brain: There is no acute intracranial hemorrhage, extra-axial fluid collection, or acute infarct. Parenchymal volume is normal.  The ventricles are normal in size. There is no mass lesion.  There is no midline shift. Vascular:  No hyperdense vessel or unexpected calcification. Skull: Normal. Negative for fracture or focal lesion. Sinuses/Orbits: The imaged paranasal sinuses are clear. The globes and orbits are unremarkable. Other: None. IMPRESSION: No acute intracranial pathology. Electronically Signed   By: Lesia Hausen M.D.   On: 08/01/2021 08:51   DG Pelvis Portable  Result Date: 07/31/2021 CLINICAL DATA:  Status post total right hip arthroplasty. EXAM: PORTABLE PELVIS 1-2 VIEWS COMPARISON:  04/18/2020 FINDINGS: The right total hip arthroplasty components are well seated. No complicating features are identified. The pubic symphysis and SI joints are intact.  No pelvic fractures. Stable advanced degenerative changes involving the left hip. IMPRESSION: Well seated right total hip arthroplasty components without complicating features. Electronically Signed   By: Rudie Meyer M.D.   On: 07/31/2021 11:38   DG C-Arm 1-60 Min-No Report  Result Date: 07/31/2021 Fluoroscopy was utilized by the requesting physician.  No radiographic interpretation.   DG C-Arm 1-60 Min-No Report  Result Date: 07/31/2021 Fluoroscopy was utilized by the requesting physician.  No radiographic interpretation.   DG HIP UNILAT WITH PELVIS 1V RIGHT  Result Date: 07/31/2021 CLINICAL DATA:  Postop right hip replacement EXAM: DG HIP (WITH OR WITHOUT PELVIS) 1V RIGHT COMPARISON:  None. FINDINGS: Fluoroscopic images were obtained intraoperatively and submitted for post operative interpretation. Right total hip arthroplasty with hardware in expected position, 4 images were obtained with 13 seconds of fluoroscopy time. Please see the performing provider's procedural report for further detail. IMPRESSION: See above. Electronically Signed   By: Allegra Lai M.D.   On: 07/31/2021 11:05    Disposition: Discharge  disposition: 01-Home or Self Care       Discharge Instructions     Call MD / Call 911   Complete by: As directed    If you experience  chest pain or shortness of breath, CALL 911 and be transported to the hospital emergency room.  If you develope a fever above 101 F, pus (white drainage) or increased drainage or redness at the wound, or calf pain, call your surgeon's office.   Call MD / Call 911   Complete by: As directed    If you experience chest pain or shortness of breath, CALL 911 and be transported to the hospital emergency room.  If you develope a fever above 101 F, pus (white drainage) or increased drainage or redness at the wound, or calf pain, call your surgeon's office.   Constipation Prevention   Complete by: As directed    Drink plenty of fluids.  Prune juice may be helpful.  You may use a stool softener, such as Colace (over the counter) 100 mg twice a day.  Use MiraLax (over the counter) for constipation as needed.   Constipation Prevention   Complete by: As directed    Drink plenty of fluids.  Prune juice may be helpful.  You may use a stool softener, such as Colace (over the counter) 100 mg twice a day.  Use MiraLax (over the counter) for constipation as needed.   Diet - low sodium heart healthy   Complete by: As directed    Diet - low sodium heart healthy   Complete by: As directed    Driving restrictions   Complete by: As directed    No driving for 6 weeks   Driving restrictions   Complete by: As directed    No driving for 6 weeks   Increase activity slowly as tolerated   Complete by: As directed    Increase activity slowly as tolerated   Complete by: As directed    Lifting restrictions   Complete by: As directed    No lifting for 6 weeks   Lifting restrictions   Complete by: As directed    No lifting for 6 weeks   Post-operative opioid taper instructions:   Complete by: As directed    POST-OPERATIVE OPIOID TAPER INSTRUCTIONS: It is important to wean off of your opioid medication as soon as possible. If you do not need pain medication after your surgery it is ok to stop day one. Opioids  include: Codeine, Hydrocodone(Norco, Vicodin), Oxycodone(Percocet, oxycontin) and hydromorphone amongst others.  Long term and even short term use of opiods can cause: Increased pain response Dependence Constipation Depression Respiratory depression And more.  Withdrawal symptoms can include Flu like symptoms Nausea, vomiting And more Techniques to manage these symptoms Hydrate well Eat regular healthy meals Stay active Use relaxation techniques(deep breathing, meditating, yoga) Do Not substitute Alcohol to help with tapering If you have been on opioids for less than two weeks and do not have pain than it is ok to stop all together.  Plan to wean off of opioids This plan should start within one week post op of your joint replacement. Maintain the same interval or time between taking each dose and first decrease the dose.  Cut the total daily intake of opioids by one tablet each day Next start to increase the time between doses. The last dose that should be eliminated is the evening dose.      Post-operative opioid taper instructions:   Complete by: As  directed    POST-OPERATIVE OPIOID TAPER INSTRUCTIONS: It is important to wean off of your opioid medication as soon as possible. If you do not need pain medication after your surgery it is ok to stop day one. Opioids include: Codeine, Hydrocodone(Norco, Vicodin), Oxycodone(Percocet, oxycontin) and hydromorphone amongst others.  Long term and even short term use of opiods can cause: Increased pain response Dependence Constipation Depression Respiratory depression And more.  Withdrawal symptoms can include Flu like symptoms Nausea, vomiting And more Techniques to manage these symptoms Hydrate well Eat regular healthy meals Stay active Use relaxation techniques(deep breathing, meditating, yoga) Do Not substitute Alcohol to help with tapering If you have been on opioids for less than two weeks and do not have pain than it  is ok to stop all together.  Plan to wean off of opioids This plan should start within one week post op of your joint replacement. Maintain the same interval or time between taking each dose and first decrease the dose.  Cut the total daily intake of opioids by one tablet each day Next start to increase the time between doses. The last dose that should be eliminated is the evening dose.      TED hose   Complete by: As directed    Use stockings (TED hose) for 2 weeks on both leg(s).  You may remove them at night for sleeping.   TED hose   Complete by: As directed    Use stockings (TED hose) for 2 weeks on both leg(s).  You may remove them at night for sleeping.        Follow-up Information     Swinteck, Arlys John, MD. Schedule an appointment as soon as possible for a visit in 2 week(s).   Specialty: Orthopedic Surgery Why: For wound re-check Contact information: [address] [place] [address] [phone]                  Signed: Darrick Grinder 08/02/2021, 12:55 PM

## 2021-08-02 NOTE — Progress Notes (Signed)
Pt stable with no needs at this time. Pt was able to walk around in room with walker and brush her teeth. No dizziness or faintness to report. Pt hoping to d/c today. Rn continuing to medicate for pain.

## 2021-08-02 NOTE — Progress Notes (Signed)
PROGRESS NOTE    Angela Patrick  DUK:[id number] DOB: [birth year] DOA: 07/31/2021 PCP: Ileana Ladd, MD    Brief Narrative:  70 y.o. female osteoarthritis, benign liver cysts, enlarged thyroid, hypothyroidism, history of orthostatic hypotension who we are seeing due to hypotension and transient change in mental status earlier this morning.  The patient stated that she has been mildly lightheaded.  This morning she was trying to get out of bed and felt like she got out of bed too fast.  She became more lightheaded, nauseous and mildly diaphoretic.  She describes that she started having tunnel vision, then lost her vision and nearly passed out, but stated that she could still hear some of the staff talking.  Her night shift nurse described that she had a blank stare for a while, her eyes were rolled back and was not responding to verbal stimuli.  They were able to get the patient back in bed and then she became more alert.  They lower HOB.  Rapid response was called and she received a 500 mL NS bolus.  She also fell mildly dyspneic while having a chest pressure which still bothers her when she takes a deep breath.  At the moment, no hypotensive symptoms, but is having headache in her frontal area.  She was recently prescribed a nasal spray for this but has not used that in the past 2 to 3 days.  No nausea at the moment, no vomiting, diarrhea, melena or hematochezia.  She has been constipated for the past 3 days.  No dysuria, frequency or hematuria.  No polyuria, polydipsia, polyphagia or blurred vision  Assessment & Plan:   Principal Problem:   Osteoarthritis of right hip Active Problems:   S/P total hip arthroplasty   Hypothyroidism   Hypokalemia   Normocytic anemia   Hyperglycemia   Hypotension   Constipation   Headache  Principal Problem: Likely Vasovagal near syncope -On further questioning, pt reports near syncope while on commode with preceding symptoms of sweats, seeing stars,  lightheadedness.  -Reports no bm in several days, does feel constipated without results thus far despite miralax and senna -Currently stable, VSS. Hemodynamically stable with no evidence of acute blood loss -have advised patient against "straining" on commode -Also recommend more aggressive bowel regimen, such as dulcolax suppository , continued miralax. Per patient, she would prefer to take cathartics on return home   Active Problems:   Hypokalemia Replaced     Constipation MOM 30 mL p.o. x1. -Per above recommend more aggressive bowel regimen, consider dulcolax suppository -Pt reports preference to take at home at time of d/c     Normocytic anemia -Remains hemodynamically stable -Hgb up to 11 at time of d/c     Headache Frontal headache due to congestion. She uses a nasal spray to home. Congestion got worse with hypotension. CT head was negative. Trial of Toradol 15 mg IVP x1.     Hyperglycemia Random glucose reviewed, stable     Osteoarthritis of right hip   S/P total hip arthroplasty Continue postop care.     Hypothyroidism Continue levothyroxine 100 mcg p.o. daily. TSH level unremarkable, reviewed  At this time, patient is medically stable. Per above, would recommend more aggressive bowel regimen at time of d/c. Have already advised patient against "straining" on the commode. Will sign off for now, thank you for this consultation, please do not hesitate to call if there are any questions.   DVT prophylaxis: SCD's Code Status: Full Family Communication: Pt  in room, family not at bedside  Antimicrobials: Anti-infectives (From admission, onward)    Start     Dose/Rate Route Frequency Ordered Stop   07/31/21 1752  ceFAZolin (ANCEF) 2-4 GM/100ML-% IVPB       Note to Pharmacy: Melina Fiddler  : cabinet override      07/31/21 1752 08/01/21 0559   07/31/21 1500  ceFAZolin (ANCEF) IVPB 2g/100 mL premix        2 g 200 mL/hr over 30 Minutes Intravenous Every 6 hours  07/31/21 1050 08/01/21 0109   07/31/21 0615  ceFAZolin (ANCEF) IVPB 2g/100 mL premix        2 g 200 mL/hr over 30 Minutes Intravenous On call to O.R. 07/31/21 0605 07/31/21 0900       Subjective: Eager to go home  Objective: Vitals:   08/01/21 1324 08/01/21 1759 08/01/21 2221 08/02/21 0545  BP: 119/68 109/70 116/79 108/76  Pulse: 64 61 66 69  Resp: 16 18 17 17   Temp: 97.6 F (36.4 C) 97.9 F (36.6 C) 98.5 F (36.9 C) 98.4 F (36.9 C)  TempSrc:      SpO2: 98% 100% 100% 98%  Weight:      Height:        Intake/Output Summary (Last 24 hours) at 08/02/2021 1259 Last data filed at 08/02/2021 1253 Gross per 24 hour  Intake 2655.38 ml  Output --  Net 2655.38 ml   Filed Weights   07/31/21 2106  Weight: 68.4 kg    Examination: General exam: Awake, laying in bed, in nad Respiratory system: Normal respiratory effort, no wheezing Cardiovascular system: regular rate, s1, s2 Gastrointestinal system: Soft, nondistended, positive BS Central nervous system: CN2-12 grossly intact, strength intact Extremities: Perfused, no clubbing Skin: Normal skin turgor, no notable skin lesions seen Psychiatry: Mood normal // no visual hallucinations   Data Reviewed: I have personally reviewed following labs and imaging studies  CBC: Recent Labs  Lab 08/01/21 0634 08/02/21 0314  WBC 7.2 7.5  NEUTROABS 4.8  --   HGB 10.6* 11.0*  HCT 31.7* 33.5*  MCV 87.6 88.4  PLT 275 250   Basic Metabolic Panel: Recent Labs  Lab 08/01/21 0634 08/02/21 0314  NA 138 135  K 3.2* 3.8  CL 105 104  CO2 26 25  GLUCOSE 148* 128*  BUN 7* 6*  CREATININE 0.92 0.78  CALCIUM 8.1* 8.3*  MG 1.7  --   PHOS 2.9  --    GFR: Estimated Creatinine Clearance: 64.5 mL/min (by C-G formula based on SCr of 0.78 mg/dL). Liver Function Tests: No results for input(s): AST, ALT, ALKPHOS, BILITOT, PROT, ALBUMIN in the last 168 hours. No results for input(s): LIPASE, AMYLASE in the last 168 hours. No results for  input(s): AMMONIA in the last 168 hours. Coagulation Profile: No results for input(s): INR, PROTIME in the last 168 hours. Cardiac Enzymes: No results for input(s): CKTOTAL, CKMB, CKMBINDEX, TROPONINI in the last 168 hours. BNP (last 3 results) No results for input(s): PROBNP in the last 8760 hours. HbA1C: No results for input(s): HGBA1C in the last 72 hours. CBG: Recent Labs  Lab 08/01/21 0604  GLUCAP 110*   Lipid Profile: No results for input(s): CHOL, HDL, LDLCALC, TRIG, CHOLHDL, LDLDIRECT in the last 72 hours. Thyroid Function Tests: Recent Labs    08/01/21 0633  TSH 3.125   Anemia Panel: No results for input(s): VITAMINB12, FOLATE, FERRITIN, TIBC, IRON, RETICCTPCT in the last 72 hours. Sepsis Labs: No results for input(s): PROCALCITON, LATICACIDVEN in  the last 168 hours.  Recent Results (from the past 240 hour(s))  Resp Panel by RT-PCR (Flu A&B, Covid) Nasopharyngeal Swab     Status: None   Collection Time: 08/01/21  1:43 PM   Specimen: Nasopharyngeal Swab; Nasopharyngeal(NP) swabs in vial transport medium  Result Value Ref Range Status   SARS Coronavirus 2 by RT PCR NEGATIVE NEGATIVE Final    Comment: (NOTE) SARS-CoV-2 target nucleic acids are NOT DETECTED.  The SARS-CoV-2 RNA is generally detectable in upper respiratory specimens during the acute phase of infection. The lowest concentration of SARS-CoV-2 viral copies this assay can detect is 138 copies/mL. A negative result does not preclude SARS-Cov-2 infection and should not be used as the sole basis for treatment or other patient management decisions. A negative result may occur with  improper specimen collection/handling, submission of specimen other than nasopharyngeal swab, presence of viral mutation(s) within the areas targeted by this assay, and inadequate number of viral copies(<138 copies/mL). A negative result must be combined with clinical observations, patient history, and  epidemiological information. The expected result is Negative.  Fact Sheet for Patients:  BloggerCourse.com  Fact Sheet for Healthcare Providers:  SeriousBroker.it  This test is no t yet approved or cleared by the [place] FDA and  has been authorized for detection and/or diagnosis of SARS-CoV-2 by FDA under an Emergency Use Authorization (EUA). This EUA will remain  in effect (meaning this test can be used) for the duration of the COVID-19 declaration under Section 564(b)(1) of the Act, 21 U.S.C.section 360bbb-3(b)(1), unless the authorization is terminated  or revoked sooner.       Influenza A by PCR NEGATIVE NEGATIVE Final   Influenza B by PCR NEGATIVE NEGATIVE Final    Comment: (NOTE) The Xpert Xpress SARS-CoV-2/FLU/RSV plus assay is intended as an aid in the diagnosis of influenza from Nasopharyngeal swab specimens and should not be used as a sole basis for treatment. Nasal washings and aspirates are unacceptable for Xpert Xpress SARS-CoV-2/FLU/RSV testing.  Fact Sheet for Patients: BloggerCourse.com  Fact Sheet for Healthcare Providers: SeriousBroker.it  This test is not yet approved or cleared by the [place] FDA and has been authorized for detection and/or diagnosis of SARS-CoV-2 by FDA under an Emergency Use Authorization (EUA). This EUA will remain in effect (meaning this test can be used) for the duration of the COVID-19 declaration under Section 564(b)(1) of the Act, 21 U.S.C. section 360bbb-3(b)(1), unless the authorization is terminated or revoked.  Performed at [facility], [address], [place], [place] [address]      Radiology Studies: CT HEAD WO CONTRAST ( )  Result Date: 08/01/2021 CLINICAL DATA:  Altered mental status, headache, near syncope EXAM: CT HEAD WITHOUT CONTRAST TECHNIQUE: Contiguous axial images were  obtained from the base of the skull through the vertex without intravenous contrast. COMPARISON:  CT head 12268 FINDINGS: Brain: There is no acute intracranial hemorrhage, extra-axial fluid collection, or acute infarct. Parenchymal volume is normal.  The ventricles are normal in size. There is no mass lesion.  There is no midline shift. Vascular: No hyperdense vessel or unexpected calcification. Skull: Normal. Negative for fracture or focal lesion. Sinuses/Orbits: The imaged paranasal sinuses are clear. The globes and orbits are unremarkable. Other: None. IMPRESSION: No acute intracranial pathology. Electronically Signed   By: Lesia Hausen M.D.   On: 08/01/2021 08:51    Scheduled Meds:  aspirin  81 mg Oral BID   docusate sodium  100 mg Oral BID   levothyroxine  100 mcg Oral QAC breakfast   senna  1 tablet Oral BID   Continuous Infusions:  sodium chloride Stopped (08/01/21 1552)   methocarbamol (ROBAXIN) IV 500 mg (07/31/21 1509)     LOS: 0 days   Rickey Barbara, MD [facility] Pager On Amion  If 7PM-7AM, please contact night-coverage 08/02/2021, 12:59 PM

## 2021-08-02 NOTE — Progress Notes (Signed)
    Subjective:  Patient reports pain as mild to moderate.  Denies N/V/CP/SOB. Patient states she is ready to go home  Objective:   VITALS:   Vitals:   08/01/21 1324 08/01/21 1759 08/01/21 2221 08/02/21 0545  BP: 119/68 109/70 116/79 108/76  Pulse: 64 61 66 69  Resp: 16 18 17 17   Temp: 97.6 F (36.4 C) 97.9 F (36.6 C) 98.5 F (36.9 C) 98.4 F (36.9 C)  TempSrc:      SpO2: 98% 100% 100% 98%  Weight:      Height:        NAD ABD soft Neurovascular intact Sensation intact distally Intact pulses distally Dorsiflexion/Plantar flexion intact Incision: dressing C/D/I   Lab Results  Component Value Date   WBC 7.5 08/02/2021   HGB 11.0 (L) 08/02/2021   HCT 33.5 (L) 08/02/2021   MCV 88.4 08/02/2021   PLT 250 08/02/2021   BMET    Component Value Date/Time   NA 135 08/02/2021 0314   K 3.8 08/02/2021 0314   CL 104 08/02/2021 0314   CO2 25 08/02/2021 0314   GLUCOSE 128 (H) 08/02/2021 0314   BUN 6 (L) 08/02/2021 0314   CREATININE 0.78 08/02/2021 0314   CALCIUM 8.3 (L) 08/02/2021 0314   GFRNONAA >60 08/02/2021 0314     Assessment/Plan: 2 Days Post-Op   Principal Problem:   Osteoarthritis of right hip Active Problems:   S/P total hip arthroplasty   Hypothyroidism   Hypokalemia   Normocytic anemia   Hyperglycemia   Hypotension   Constipation   Headache   WBAT with walker DVT ppx: Aspirin, SCDs, TEDS PO pain control PT/OT Mental status change: Hospitalist team consulted. Appreciate input Dispo: Plan for D/C this afternoon if she remains stable    08/04/2021 08/02/2021, 8:38 AM  [facility] is now [facility] [address], [address], [place], [place] Phone: [phone] [email or web address] Facebook  [phone]

## 2021-08-20 ENCOUNTER — Other Ambulatory Visit (HOSPITAL_COMMUNITY): Payer: Self-pay | Admitting: Orthopedic Surgery

## 2021-08-22 ENCOUNTER — Other Ambulatory Visit: Payer: Self-pay | Admitting: Family Medicine

## 2021-08-22 DIAGNOSIS — Z139 Encounter for screening, unspecified: Secondary | ICD-10-CM

## 2021-08-24 ENCOUNTER — Other Ambulatory Visit: Payer: Self-pay

## 2021-08-24 ENCOUNTER — Ambulatory Visit
Admission: RE | Admit: 2021-08-24 | Discharge: 2021-08-24 | Disposition: A | Discharge status: Home / Self Care | Payer: Federal, State, Local not specified - PPO | Source: Ambulatory Visit | Attending: Family Medicine | Admitting: Family Medicine

## 2021-08-24 DIAGNOSIS — Z139 Encounter for screening, unspecified: Secondary | ICD-10-CM

## 2021-08-28 ENCOUNTER — Other Ambulatory Visit: Payer: Self-pay | Admitting: Family Medicine

## 2021-08-28 DIAGNOSIS — R928 Other abnormal and inconclusive findings on diagnostic imaging of breast: Secondary | ICD-10-CM

## 2021-09-11 ENCOUNTER — Encounter (HOSPITAL_BASED_OUTPATIENT_CLINIC_OR_DEPARTMENT_OTHER): Payer: Self-pay | Admitting: Orthopedic Surgery

## 2021-09-11 ENCOUNTER — Other Ambulatory Visit: Payer: Self-pay

## 2021-09-11 NOTE — Progress Notes (Signed)
Per Dr. Jean Rosenthal okay to proceed with planned procedure at [facility]

## 2021-09-19 ENCOUNTER — Ambulatory Visit (HOSPITAL_BASED_OUTPATIENT_CLINIC_OR_DEPARTMENT_OTHER): Payer: Federal, State, Local not specified - PPO | Admitting: Certified Registered"

## 2021-09-19 ENCOUNTER — Encounter (HOSPITAL_BASED_OUTPATIENT_CLINIC_OR_DEPARTMENT_OTHER): Payer: Self-pay | Admitting: Orthopedic Surgery

## 2021-09-19 ENCOUNTER — Other Ambulatory Visit: Payer: Self-pay

## 2021-09-19 ENCOUNTER — Ambulatory Visit (HOSPITAL_BASED_OUTPATIENT_CLINIC_OR_DEPARTMENT_OTHER): Payer: Federal, State, Local not specified - PPO

## 2021-09-19 ENCOUNTER — Ambulatory Visit (HOSPITAL_BASED_OUTPATIENT_CLINIC_OR_DEPARTMENT_OTHER)
Admission: RE | Admit: 2021-09-19 | Discharge: 2021-09-19 | Disposition: A | Discharge status: Home / Self Care | Payer: Federal, State, Local not specified - PPO | Attending: Orthopedic Surgery | Admitting: Orthopedic Surgery

## 2021-09-19 ENCOUNTER — Encounter (HOSPITAL_BASED_OUTPATIENT_CLINIC_OR_DEPARTMENT_OTHER)
Admission: RE | Disposition: A | Discharge status: Home / Self Care | Payer: Self-pay | Source: Home / Self Care | Attending: Orthopedic Surgery

## 2021-09-19 DIAGNOSIS — T8484XA Pain due to internal orthopedic prosthetic devices, implants and grafts, initial encounter: Secondary | ICD-10-CM | POA: Insufficient documentation

## 2021-09-19 DIAGNOSIS — E890 Postprocedural hypothyroidism: Secondary | ICD-10-CM | POA: Insufficient documentation

## 2021-09-19 DIAGNOSIS — M1990 Unspecified osteoarthritis, unspecified site: Secondary | ICD-10-CM | POA: Insufficient documentation

## 2021-09-19 DIAGNOSIS — Y798 Miscellaneous orthopedic devices associated with adverse incidents, not elsewhere classified: Secondary | ICD-10-CM | POA: Diagnosis not present

## 2021-09-19 DIAGNOSIS — Z96642 Presence of left artificial hip joint: Secondary | ICD-10-CM | POA: Insufficient documentation

## 2021-09-19 DIAGNOSIS — D649 Anemia, unspecified: Secondary | ICD-10-CM | POA: Diagnosis not present

## 2021-09-19 HISTORY — DX: Other specified postprocedural states: R11.2

## 2021-09-19 HISTORY — DX: Other disorders of plasma-protein metabolism, not elsewhere classified: E88.09

## 2021-09-19 HISTORY — PX: HARDWARE REMOVAL: SHX979

## 2021-09-19 HISTORY — DX: Other specified postprocedural states: Z98.890

## 2021-09-19 SURGERY — REMOVAL, HARDWARE
Anesthesia: Monitor Anesthesia Care | Site: Foot | Laterality: Left

## 2021-09-19 MED ORDER — BUPIVACAINE HCL (PF) 0.5 % IJ SOLN
INTRAMUSCULAR | Status: AC
  Filled 2021-09-19: qty 30

## 2021-09-19 MED ORDER — SODIUM CHLORIDE 0.9 % IV SOLN: INTRAVENOUS | Status: DC

## 2021-09-19 MED ORDER — LACTATED RINGERS IV SOLN: INTRAVENOUS | Status: DC

## 2021-09-19 MED ORDER — OXYCODONE HCL 5 MG PO TABS
5.0000 mg | ORAL_TABLET | Freq: Once | ORAL | Status: AC | PRN
  Administered 2021-09-19: 5 mg via ORAL

## 2021-09-19 MED ORDER — CEFAZOLIN SODIUM-DEXTROSE 2-4 GM/100ML-% IV SOLN
2.0000 g | INTRAVENOUS | Status: AC
  Administered 2021-09-19: 2 g via INTRAVENOUS

## 2021-09-19 MED ORDER — PROPOFOL 10 MG/ML IV BOLUS: INTRAVENOUS | Status: DC | PRN

## 2021-09-19 MED ORDER — FENTANYL CITRATE (PF) 100 MCG/2ML IJ SOLN: 25.0000 ug | INTRAMUSCULAR | Status: DC | PRN

## 2021-09-19 MED ORDER — 0.9 % SODIUM CHLORIDE (POUR BTL) OPTIME
TOPICAL | Status: DC | PRN
  Administered 2021-09-19: 120 mL

## 2021-09-19 MED ORDER — BUPIVACAINE-EPINEPHRINE (PF) 0.5% -1:200000 IJ SOLN
INTRAMUSCULAR | Status: AC
  Filled 2021-09-19: qty 30

## 2021-09-19 MED ORDER — LIDOCAINE-EPINEPHRINE 1 %-1:100000 IJ SOLN
INTRAMUSCULAR | Status: AC
  Filled 2021-09-19: qty 1

## 2021-09-19 MED ORDER — PROPOFOL 10 MG/ML IV BOLUS
INTRAVENOUS | Status: DC | PRN
  Administered 2021-09-19: 50 ug/kg/min via INTRAVENOUS

## 2021-09-19 MED ORDER — EPHEDRINE 5 MG/ML INJ
INTRAVENOUS | Status: AC
  Filled 2021-09-19: qty 15

## 2021-09-19 MED ORDER — CEFAZOLIN SODIUM-DEXTROSE 2-4 GM/100ML-% IV SOLN
INTRAVENOUS | Status: AC
  Filled 2021-09-19: qty 100

## 2021-09-19 MED ORDER — OXYCODONE HCL 5 MG PO TABS
ORAL_TABLET | ORAL | Status: AC
  Filled 2021-09-19: qty 1

## 2021-09-19 MED ORDER — FENTANYL CITRATE (PF) 100 MCG/2ML IJ SOLN
INTRAMUSCULAR | Status: DC | PRN
  Administered 2021-09-19: 50 ug via INTRAVENOUS

## 2021-09-19 MED ORDER — FENTANYL CITRATE (PF) 100 MCG/2ML IJ SOLN
INTRAMUSCULAR | Status: AC
  Filled 2021-09-19: qty 2

## 2021-09-19 MED ORDER — PROPOFOL 500 MG/50ML IV EMUL
INTRAVENOUS | Status: AC
  Filled 2021-09-19: qty 150

## 2021-09-19 MED ORDER — VANCOMYCIN HCL 500 MG IV SOLR
INTRAVENOUS | Status: DC | PRN
  Administered 2021-09-19: 500 mg via TOPICAL

## 2021-09-19 MED ORDER — OXYCODONE HCL 5 MG/5ML PO SOLN: 5.0000 mg | Freq: Once | ORAL | Status: AC | PRN

## 2021-09-19 MED ORDER — LIDOCAINE 2% (20 MG/ML) 5 ML SYRINGE
INTRAMUSCULAR | Status: DC | PRN
  Administered 2021-09-19: 30 mg via INTRAVENOUS

## 2021-09-19 MED ORDER — PHENYLEPHRINE 40 MCG/ML (10ML) SYRINGE FOR IV PUSH (FOR BLOOD PRESSURE SUPPORT)
PREFILLED_SYRINGE | INTRAVENOUS | Status: AC
  Filled 2021-09-19: qty 10

## 2021-09-19 MED ORDER — VANCOMYCIN HCL 500 MG IV SOLR
INTRAVENOUS | Status: AC
  Filled 2021-09-19: qty 10

## 2021-09-19 MED ORDER — ONDANSETRON HCL 4 MG/2ML IJ SOLN: 4.0000 mg | Freq: Once | INTRAMUSCULAR | Status: DC | PRN

## 2021-09-19 MED ORDER — ONDANSETRON HCL 4 MG/2ML IJ SOLN
INTRAMUSCULAR | Status: DC | PRN
  Administered 2021-09-19: 4 mg via INTRAVENOUS

## 2021-09-19 MED ORDER — BUPIVACAINE-EPINEPHRINE 0.5% -1:200000 IJ SOLN
INTRAMUSCULAR | Status: DC | PRN
  Administered 2021-09-19: 2 mL

## 2021-09-19 SURGICAL SUPPLY — 66 items
APL PRP STRL LF DISP 70% ISPRP (MISCELLANEOUS) ×1
APL SKNCLS STERI-STRIP NONHPOA (GAUZE/BANDAGES/DRESSINGS)
BANDAGE ESMARK 6X9 LF (GAUZE/BANDAGES/DRESSINGS) IMPLANT
BENZOIN TINCTURE PRP APPL 2/3 (GAUZE/BANDAGES/DRESSINGS) IMPLANT
BLADE SURG 15 STRL LF DISP TIS (BLADE) ×2 IMPLANT
BLADE SURG 15 STRL SS (BLADE) ×4
BNDG CMPR 9X4 STRL LF SNTH (GAUZE/BANDAGES/DRESSINGS)
BNDG CMPR 9X6 STRL LF SNTH (GAUZE/BANDAGES/DRESSINGS)
BNDG COHESIVE 4X5 TAN ST LF (GAUZE/BANDAGES/DRESSINGS) IMPLANT
BNDG COHESIVE 6X5 TAN ST LF (GAUZE/BANDAGES/DRESSINGS) IMPLANT
BNDG ELASTIC 4X5.8 VLCR STR LF (GAUZE/BANDAGES/DRESSINGS) ×2 IMPLANT
BNDG ESMARK 4X9 LF (GAUZE/BANDAGES/DRESSINGS) IMPLANT
BNDG ESMARK 6X9 LF (GAUZE/BANDAGES/DRESSINGS)
CHLORAPREP W/TINT 26 (MISCELLANEOUS) ×2 IMPLANT
COVER BACK TABLE 60X90IN (DRAPES) ×2 IMPLANT
CUFF TOURN SGL QUICK 34 (TOURNIQUET CUFF)
CUFF TRNQT CYL 34X4.125X (TOURNIQUET CUFF) IMPLANT
DECANTER SPIKE VIAL GLASS SM (MISCELLANEOUS) IMPLANT
DRAPE EXTREMITY T 121X128X90 (DISPOSABLE) ×2 IMPLANT
DRAPE OEC MINIVIEW 54X84 (DRAPES) IMPLANT
DRAPE SURG 17X23 STRL (DRAPES) IMPLANT
DRAPE U-SHAPE 47X51 STRL (DRAPES) ×2 IMPLANT
DRSG MEPITEL 4X7.2 (GAUZE/BANDAGES/DRESSINGS) ×2 IMPLANT
DRSG PAD ABDOMINAL 8X10 ST (GAUZE/BANDAGES/DRESSINGS) IMPLANT
ELECT REM PT RETURN 9FT ADLT (ELECTROSURGICAL) ×2
ELECTRODE REM PT RTRN 9FT ADLT (ELECTROSURGICAL) ×1 IMPLANT
GAUZE SPONGE 4X4 12PLY STRL (GAUZE/BANDAGES/DRESSINGS) ×2 IMPLANT
GLOVE SRG 8 PF TXTR STRL LF DI (GLOVE) ×1 IMPLANT
GLOVE SURG ENC MOIS LTX SZ8 (GLOVE) IMPLANT
GLOVE SURG LTX SZ8 (GLOVE) IMPLANT
GLOVE SURG POLYISO LF SZ7 (GLOVE) ×2 IMPLANT
GLOVE SURG SYN 8.0 (GLOVE) ×2 IMPLANT
GLOVE SURG UNDER POLY LF SZ7 (GLOVE) ×4 IMPLANT
GLOVE SURG UNDER POLY LF SZ8 (GLOVE) ×2
GOWN STRL REUS W/ TWL LRG LVL3 (GOWN DISPOSABLE) IMPLANT
GOWN STRL REUS W/ TWL XL LVL3 (GOWN DISPOSABLE) ×2 IMPLANT
GOWN STRL REUS W/TWL LRG LVL3 (GOWN DISPOSABLE)
GOWN STRL REUS W/TWL XL LVL3 (GOWN DISPOSABLE) ×4
NEEDLE HYPO 22GX1.5 SAFETY (NEEDLE) ×2 IMPLANT
PACK BASIN DAY SURGERY FS (CUSTOM PROCEDURE TRAY) ×2 IMPLANT
PAD CAST 4YDX4 CTTN HI CHSV (CAST SUPPLIES) ×1 IMPLANT
PADDING CAST COTTON 4X4 STRL (CAST SUPPLIES) ×2
PADDING CAST COTTON 6X4 STRL (CAST SUPPLIES) IMPLANT
PENCIL SMOKE EVACUATOR (MISCELLANEOUS) ×2 IMPLANT
SANITIZER HAND PURELL 535ML FO (MISCELLANEOUS) ×2 IMPLANT
SHEET MEDIUM DRAPE 40X70 STRL (DRAPES) ×2 IMPLANT
SLEEVE SCD COMPRESS KNEE MED (STOCKING) ×2 IMPLANT
SPLINT FAST PLASTER 5X30 (CAST SUPPLIES)
SPLINT PLASTER CAST FAST 5X30 (CAST SUPPLIES) IMPLANT
SPONGE T-LAP 18X18 ~~LOC~~+RFID (SPONGE) ×2 IMPLANT
STOCKINETTE 6  STRL (DRAPES) ×1
STOCKINETTE 6 STRL (DRAPES) ×1 IMPLANT
STRIP CLOSURE SKIN 1/2X4 (GAUZE/BANDAGES/DRESSINGS) IMPLANT
SUCTION FRAZIER HANDLE 10FR (MISCELLANEOUS) ×1
SUCTION TUBE FRAZIER 10FR DISP (MISCELLANEOUS) ×1 IMPLANT
SUT ETHILON 3 0 PS 1 (SUTURE) ×2 IMPLANT
SUT MNCRL AB 3-0 PS2 18 (SUTURE) ×2 IMPLANT
SUT VIC AB 2-0 SH 18 (SUTURE) IMPLANT
SUT VIC AB 2-0 SH 27 (SUTURE)
SUT VIC AB 2-0 SH 27XBRD (SUTURE) IMPLANT
SUT VICRYL 0 SH 27 (SUTURE) IMPLANT
SYR BULB EAR ULCER 3OZ GRN STR (SYRINGE) ×2 IMPLANT
SYR CONTROL 10ML LL (SYRINGE) ×2 IMPLANT
TOWEL GREEN STERILE FF (TOWEL DISPOSABLE) ×2 IMPLANT
TUBE CONNECTING 20X1/4 (TUBING) ×2 IMPLANT
UNDERPAD 30X36 HEAVY ABSORB (UNDERPADS AND DIAPERS) ×2 IMPLANT

## 2021-09-19 NOTE — Transfer of Care (Signed)
Immediate Anesthesia Transfer of Care Note  Patient: Angela Patrick  Procedure(s) Performed: Left foot removal of deep implants (Left: Foot)  Patient Location: PACU  Anesthesia Type:MAC  Level of Consciousness: awake, alert , oriented and patient cooperative  Airway & Oxygen Therapy: Patient Spontanous Breathing and Patient connected to face mask oxygen  Post-op Assessment: Report given to RN and Post -op Vital signs reviewed and stable  Post vital signs: Reviewed and stable  Last Vitals:  Vitals Value Taken Time  BP 110/71 09/19/21 1345  Temp    Pulse 57 09/19/21 1346  Resp 10 09/19/21 1346  SpO2 100 % 09/19/21 1346  Vitals shown include unvalidated device data.  Last Pain:  Vitals:   09/19/21 1111  TempSrc: Oral  PainSc: 9       Patients Stated Pain Goal: 5 (62/95/28 4132)  Complications: No notable events documented.

## 2021-09-19 NOTE — H&P (Signed)
Angela Patrick is an 70 y.o. female.   Chief Complaint: Left foot pain HPI: 70 year old female complains of left foot pain after bunion correction.  She has painful screws at the first metatarsal and presents today for surgical removal.  Past Medical History:  Diagnosis Date   Arthritis    osteoarthritis   Benign liver cyst 2011   Complication of anesthesia 02/2018   Difficult breathing, tremors, palpitations, cold chills from previous dental procedure   Enlarged thyroid    Goiter    ultrasound in 2014 with fine needle aspiration   History of orthostatic hypotension    Hypothyroidism    PONV (postoperative nausea and vomiting)    Pseudocholinesterase deficiency    Pseudocholinesterase deficiency     Past Surgical History:  Procedure Laterality Date   ANKLE SURGERY Right    BUNIONECTOMY WITH HAMMERTOE RECONSTRUCTION Left 03/03/2019   Procedure: Left Foot Scarf/Akin Osteotomy/Bunion Correction; Left Second Metatarsal Weil and Hammertoe Correction;  Surgeon: Toni Arthurs, MD;  Location: [facility];  Service: Orthopedics;  Laterality: Left;   HARDWARE REMOVAL Right 03/03/2019   Procedure: Right Ankle Removal Deep Implant Tibia/Fibula ;  Surgeon: Toni Arthurs, MD;  Location: [facility];  Service: Orthopedics;  Laterality: Right;   HERNIA REPAIR     THYROIDECTOMY N/A 08/26/2018   Procedure: TOTAL THYROIDECTOMY;  Surgeon: Darnell Level, MD;  Location: [facility];  Service: General;  Laterality: N/A;   THYROIDECTOMY     TOTAL HIP ARTHROPLASTY Right 07/31/2021   Procedure: TOTAL HIP ARTHROPLASTY ANTERIOR APPROACH;  Surgeon: Samson Frederic, MD;  Location: [facility];  Service: Orthopedics;  Laterality: Right;    Family History  Problem Relation Age of Onset   CAD Mother    Goiter Mother    Cancer Mother    AAA (abdominal aortic aneurysm) Father    Pancreatic cancer Sister    Social History:  reports that she has never smoked. She has never used smokeless  tobacco. She reports current alcohol use. She reports that she does not use drugs.  Allergies:  Allergies  Allergen Reactions   Iodine Swelling    Face and arm swelling   Kiwi Extract Hives   Other Other (See Comments)    Bleach cause blisters on hands.   Pineapple Hives   Strawberry Extract Hives   Penicillins Rash    Has patient had a PCN reaction causing immediate rash, facial/tongue/throat swelling, SOB or lightheadedness with hypotension: no Has patient had a PCN reaction causing severe rash involving mucus membranes or skin necrosis: no  Has patient had a PCN reaction that required hospitalization: no Has patient had a PCN reaction occurring within the last 10 years: no If all of the above answers are "NO", then may proceed with Cephalosporin use.     Medications Prior to Admission  Medication Sig Dispense Refill   acetaminophen (TYLENOL) 650 MG CR tablet Take 650 mg by mouth daily as needed (pain).     fexofenadine (ALLEGRA) 180 MG tablet Take 180 mg by mouth daily.     folic acid (FOLVITE) 1 MG tablet Take 1 mg by mouth daily.     levothyroxine (SYNTHROID) 100 MCG tablet Take 100 mcg by mouth daily before breakfast.     montelukast (SINGULAIR) 10 MG tablet Take 1 tablet (10 mg total) by mouth at bedtime. 30 tablet 5   ondansetron (ZOFRAN) 4 MG tablet Take 1 tablet (4 mg total) by mouth every 8 (eight) hours as needed for nausea or vomiting.  20 tablet 0   predniSONE (DELTASONE) 20 MG tablet Take 20 mg by mouth daily with breakfast.     senna (SENOKOT) 8.6 MG TABS tablet Take 2 tablets (17.2 mg total) by mouth at bedtime. 60 tablet 1   Biotin w/ Vitamins C & E (HAIR SKIN & NAILS GUMMIES PO) Take by mouth.     Calcium-Phosphorus-Vitamin D (CITRACAL +D3 PO) Take 1,200 mg by mouth daily. 1200 mg / 1000 mg     docusate sodium (COLACE) 100 MG capsule Take 1 capsule (100 mg total) by mouth 2 (two) times daily. 60 capsule 1   EPINEPHrine 0.3 mg/0.3 mL IJ SOAJ injection Use as  directed for life-threatening allergic reaction. 2 each 1   Multiple Vitamin (MULTIVITAMIN WITH MINERALS) TABS tablet Take 1 tablet by mouth daily. gummies     TURMERIC-GINGER PO Take 500 mg by mouth daily. Curcumin      No results found for this or any previous visit (from the past 48 hour(s)). DG MINI C-ARM IMAGE ONLY  Result Date: Oct 13, 2021 There is no interpretation for this exam.  This order is for images obtained during a surgical procedure.  Please See "Surgeries" Tab for more information regarding the procedure.    Review of Systems no recent fever, chills, nausea, vomiting or changes in her appetite  Blood pressure 137/84, pulse (!) 56, temperature 97.9 F (36.6 C), temperature source Oral, resp. rate 18, height 5\' 5"  (1.651 m), weight 66.3 kg, SpO2 100 %. Physical Exam  Well-nourished well-developed woman in no apparent distress.  Alert and oriented.  Normal mood and affect.  Gait is normal.  Left foot has prominent hardware dorsally at the first metatarsal neck.  Surgical incision is healed.  No signs of infection.   Assessment/Plan Painful hardware left foot -to the operating room today for removal of the deep implants from the first metatarsal.  The risks and benefits of the alternative treatment options have been discussed in detail.  The patient wishes to proceed with surgery and specifically understands risks of bleeding, infection, nerve damage, blood clots, need for additional surgery, amputation and death.   , MD 10-13-2021, 12:49 PM

## 2021-09-19 NOTE — Anesthesia Procedure Notes (Signed)
Procedure Name: MAC Date/Time: 09/19/2021 1:27 PM Performed by: Signe Colt, CRNA Pre-anesthesia Checklist: Patient identified, Emergency Drugs available, Suction available, Timeout performed and Patient being monitored Patient Re-evaluated:Patient Re-evaluated prior to induction Oxygen Delivery Method: Simple face mask

## 2021-09-19 NOTE — Anesthesia Preprocedure Evaluation (Signed)
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   Reviewed: Allergy & Precautions, NPO status , Patient's Chart, lab work & pertinent test results, reviewed documented beta blocker date and time   History of Anesthesia Complications (+) PONV, PSEUDOCHOLINESTERASE DEFICIENCY and history of anesthetic complications  Airway Mallampati: II  TM Distance: >3 FB Neck ROM: Full    Dental no notable dental hx. (+) Teeth Intact   Pulmonary neg pulmonary ROS,    Pulmonary exam normal breath sounds clear to auscultation       Cardiovascular negative cardio ROS Normal cardiovascular exam Rhythm:Regular Rate:Normal     Neuro/Psych  Headaches, negative psych ROS   GI/Hepatic negative GI ROS, Neg liver ROS,   Endo/Other  Hypothyroidism Hx/o multinodular goiter S/P thyroidectomy on supplemental thyroid Rx  Renal/GU negative Renal ROS  negative genitourinary   Musculoskeletal  (+) Arthritis , Osteoarthritis,  Chronic back pain S/P Left THR Retained painful  hardware left foot/ankle   Abdominal   Peds  Hematology negative hematology ROS (+) anemia ,   Anesthesia Other Findings   Reproductive/Obstetrics                             Anesthesia Physical  Anesthesia Plan  ASA: 2  Anesthesia Plan: MAC   Post-op Pain Management:  Regional for Post-op pain   Induction: Intravenous  PONV Risk Score and Plan: 4 or greater and Ondansetron, Dexamethasone, Treatment may vary due to age or medical condition and Propofol infusion  Airway Management Planned: Natural Airway and Mask  Additional Equipment:   Intra-op Plan:   Post-operative Plan:   Informed Consent: I have reviewed the patients History and Physical, chart,  labs and discussed the procedure including the risks, benefits and alternatives for the proposed anesthesia with the patient or authorized representative who has indicated his/her understanding and acceptance.     Dental advisory given  Plan Discussed with: CRNA and Surgeon  Anesthesia Plan Comments: (Patient with history of pseudocholinesterase deficiency by chart review.  Patient states she had an issue with palpitations during a dental procedure after being injected with local anesthesia which required prolonged monitoring at dental office and supplemental O2.  In November, had thyroidectomy and patient states she was difficult to arouse in PACU.  Chart review did not reveal any complications post-operatively.  Since thyroid surgery, patient states her throat feels tight in AM, but denies difficulty swallowing or breathing.  Patient questions answered.  Physical exam revealed no obvious airway abnormalities.    )        Anesthesia Quick Evaluation

## 2021-09-19 NOTE — Op Note (Signed)
09/19/2021  1:53 PM  PATIENT:  Angela Patrick  70 y.o. female  PRE-OPERATIVE DIAGNOSIS:  left foot painful hardware  POST-OPERATIVE DIAGNOSIS:  left foot painful hardware  Procedure(s):  Left foot removal of deep implants  SURGEON:  Wylene Simmer, MD  ASSISTANT: none  ANESTHESIA:   local, MAC  EBL:  minimal   TOURNIQUET:  none  COMPLICATIONS:  None apparent  DISPOSITION:  Extubated, awake and stable to recovery.  INDICATION FOR PROCEDURE: 70 year old female without significant past medical history complains of painful hardware after bunion correction.  She has 2 palpable screws over the dorsum of the first metatarsal neck.  She has failed nonoperative treatment to date and presents today for surgical removal of these deep painful implants.  The risks and benefits of the alternative treatment options have been discussed in detail.  The patient wishes to proceed with surgery and specifically understands risks of bleeding, infection, nerve damage, blood clots, need for additional surgery, amputation and death.   PROCEDURE IN DETAIL:  After pre operative consent was obtained, and the correct operative site was identified, the patient was brought to the operating room and placed supine on the OR table.  Anesthesia was administered.  Pre-operative antibiotics were administered.  A surgical timeout was taken.  The left lower extremity was prepped and draped in standard sterile fashion.  Half percent Marcaine with epinephrine was infiltrated around the incision over the palpable screws.  The incision in this area was opened sharply and dissection carried down through the subcutaneous tissues.  The distal screw head was identified.  Was cleaned of all soft tissue and removed without difficulty.  The more proximal screw head was then identified and removed in the same fashion.  The wound was irrigated and sprinkled with vancomycin powder.  Skin incision was closed with horizontal mattress sutures of  3-0 nylon.  Sterile dressings were applied followed by compression wrap.  The patient was awakened from anesthesia and transported to the recovery room in stable condition.   FOLLOW UP PLAN: Weightbearing as tolerated on the left in a postop shoe.  Follow-up in 2 weeks for suture removal.

## 2021-09-19 NOTE — Anesthesia Postprocedure Evaluation (Signed)
Anesthesia Post Note  Patient: Angela Patrick  Procedure(s) Performed: Left foot removal of deep implants (Left: Foot)     Patient location during evaluation: PACU Anesthesia Type: MAC Level of consciousness: awake and alert and oriented Pain management: pain level controlled Vital Signs Assessment: post-procedure vital signs reviewed and stable Respiratory status: spontaneous breathing, nonlabored ventilation and respiratory function stable Cardiovascular status: stable and blood pressure returned to baseline Postop Assessment: no apparent nausea or vomiting Anesthetic complications: no   No notable events documented.  Last Vitals:  Vitals:   09/19/21 1400 09/19/21 1415  BP: 120/78 109/75  Pulse: (!) 56 (!) 57  Resp: 15 16  Temp:    SpO2: 100% 99%    Last Pain:  Vitals:   09/19/21 1415  TempSrc:   PainSc: 0-No pain                 Angela Karapetyan A.

## 2021-09-19 NOTE — Discharge Instructions (Addendum)
Toni Arthurs, MD [facility]  Please read the following information regarding your care after surgery.  Medications  You only need a prescription for the narcotic pain medicine (ex. oxycodone, Percocet, Norco).  All of the other medicines listed below are available over the counter. X acetominophen (Tylenol) 650 mg every 4-6 hours as you need for minor to moderate pain  Weight Bearing X Bear weight only on your operated foot in the post-op shoe.  Cast / Splint / Dressing X Remove your dressing 3 days after surgery and cover the incisions with dry dressings.    After your dressing, cast or splint is removed; you may shower, but do not soak or scrub the wound.  Allow the water to run over it, and then gently pat it dry.  Swelling It is normal for you to have swelling where you had surgery.  To reduce swelling and pain, keep your toes above your nose for at least 3 days after surgery.  It may be necessary to keep your foot or leg elevated for several weeks.  If it hurts, it should be elevated.  Follow Up Call my office at [phone] when you are discharged from the hospital or surgery center to schedule an appointment to be seen two weeks after surgery.  Call my office at [phone] if you develop a fever >101.5 F, nausea, vomiting, bleeding from the surgical site or severe pain.     Post Anesthesia Home Care Instructions  Activity: Get plenty of rest for the remainder of the day. A responsible individual must stay with you for 24 hours following the procedure.  For the next 24 hours, DO NOT: -Drive a car -Advertising copywriter -Drink alcoholic beverages -Take any medication unless instructed by your physician -Make any legal decisions or sign important papers.  Meals: Start with liquid foods such as gelatin or soup. Progress to regular foods as tolerated. Avoid greasy, spicy, heavy foods. If nausea and/or vomiting occur, drink only clear liquids until the nausea and/or vomiting  subsides. Call your physician if vomiting continues.  Special Instructions/Symptoms: Your throat may feel dry or sore from the anesthesia or the breathing tube placed in your throat during surgery. If this causes discomfort, gargle with warm salt water. The discomfort should disappear within 24 hours.  If you had a scopolamine patch placed behind your ear for the management of post- operative nausea and/or vomiting:  1. The medication in the patch is effective for 72 hours, after which it should be removed.  Wrap patch in a tissue and discard in the trash. Wash hands thoroughly with soap and water. 2. You may remove the patch earlier than 72 hours if you experience unpleasant side effects which may include dry mouth, dizziness or visual disturbances. 3. Avoid touching the patch. Wash your hands with soap and water after contact with the patch.

## 2021-09-20 ENCOUNTER — Encounter (HOSPITAL_BASED_OUTPATIENT_CLINIC_OR_DEPARTMENT_OTHER): Payer: Self-pay | Admitting: Orthopedic Surgery

## 2021-10-09 ENCOUNTER — Ambulatory Visit
Admission: RE | Admit: 2021-10-09 | Discharge: 2021-10-09 | Disposition: A | Discharge status: Home / Self Care | Payer: Federal, State, Local not specified - PPO | Source: Ambulatory Visit | Attending: Family Medicine | Admitting: Family Medicine

## 2021-10-09 ENCOUNTER — Other Ambulatory Visit: Payer: Self-pay | Admitting: Family Medicine

## 2021-10-09 DIAGNOSIS — R928 Other abnormal and inconclusive findings on diagnostic imaging of breast: Secondary | ICD-10-CM

## 2021-10-09 DIAGNOSIS — N6320 Unspecified lump in the left breast, unspecified quadrant: Secondary | ICD-10-CM

## 2021-10-18 ENCOUNTER — Ambulatory Visit
Admission: RE | Admit: 2021-10-18 | Discharge: 2021-10-18 | Disposition: A | Discharge status: Home / Self Care | Payer: Federal, State, Local not specified - PPO | Source: Ambulatory Visit | Attending: Family Medicine | Admitting: Family Medicine

## 2021-10-18 DIAGNOSIS — N6320 Unspecified lump in the left breast, unspecified quadrant: Secondary | ICD-10-CM

## 2022-07-25 IMAGING — US US BREAST BX W LOC DEV 1ST LESION IMG BX SPEC US GUIDE*L*
1 series · 11 of 11 positions shown · non-contrast
Comparison: Previous exam(s).
COMPARISON: Previous exam(s).

Addendum:
CLINICAL DATA: Patient presents for ultrasound-guided core biopsy
of LEFT breast mass.

EXAM:
ULTRASOUND GUIDED LEFT BREAST CORE NEEDLE BIOPSY

[Series 1: us breast bx w loc dev 1st lesion img bx spec us g · 0.06mm/px · 11 of 11 slices shown]
[im 1/11]
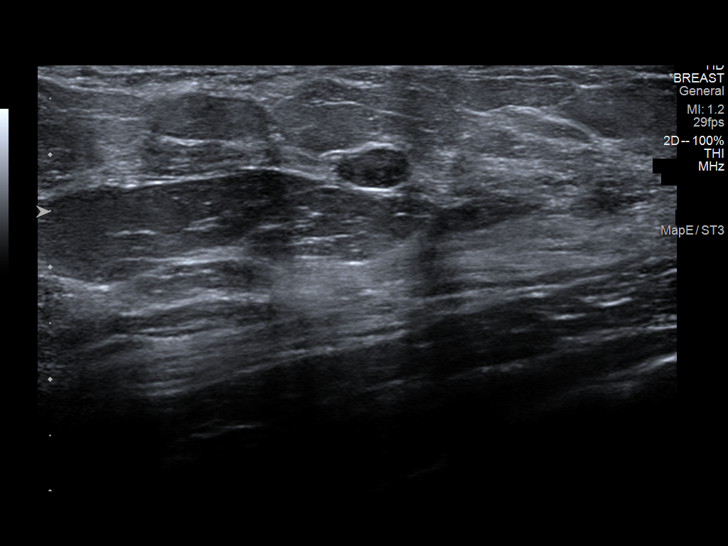
[im 2/11]
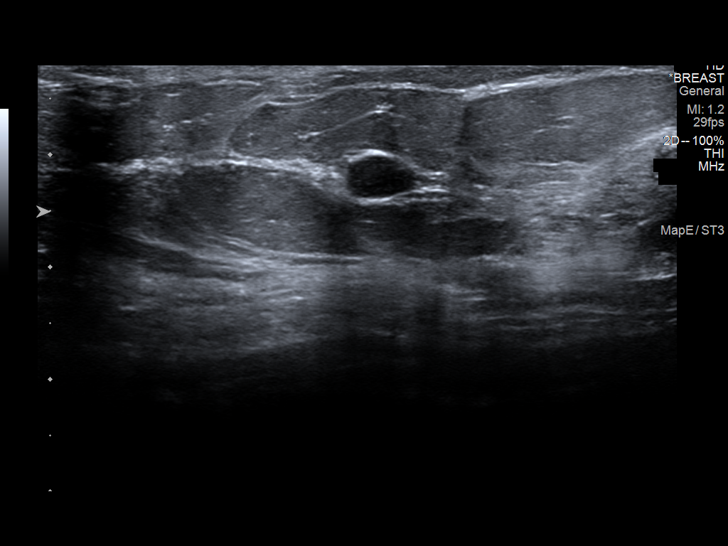
[im 3/11]
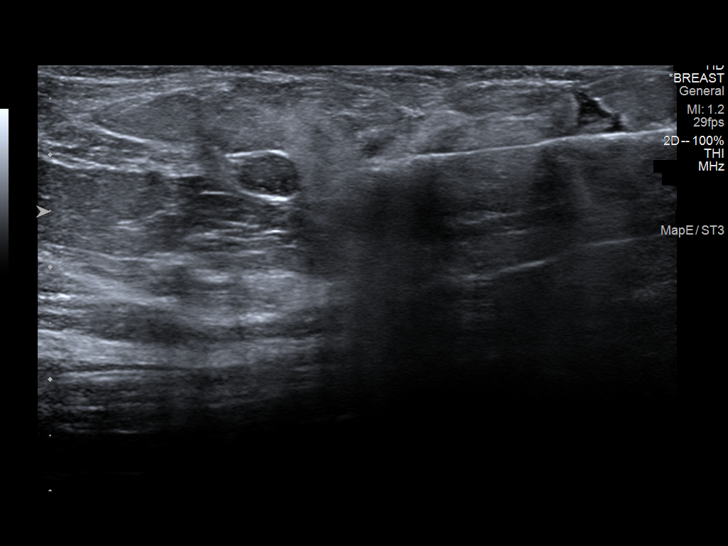
[im 4/11]
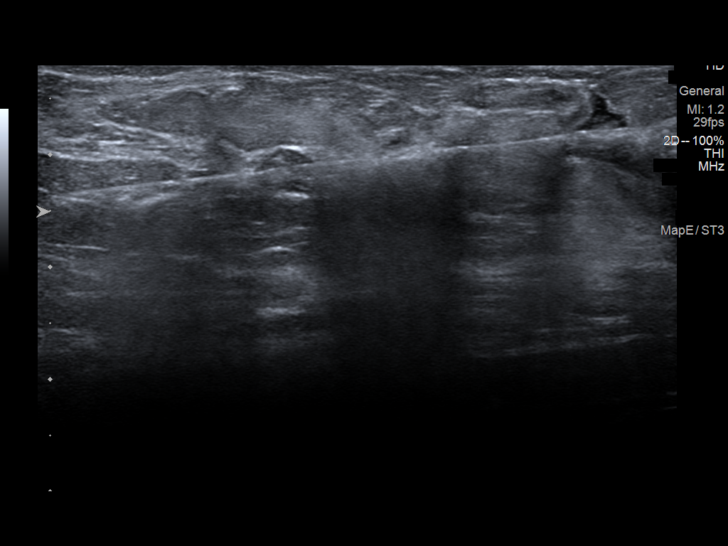
[im 5/11]
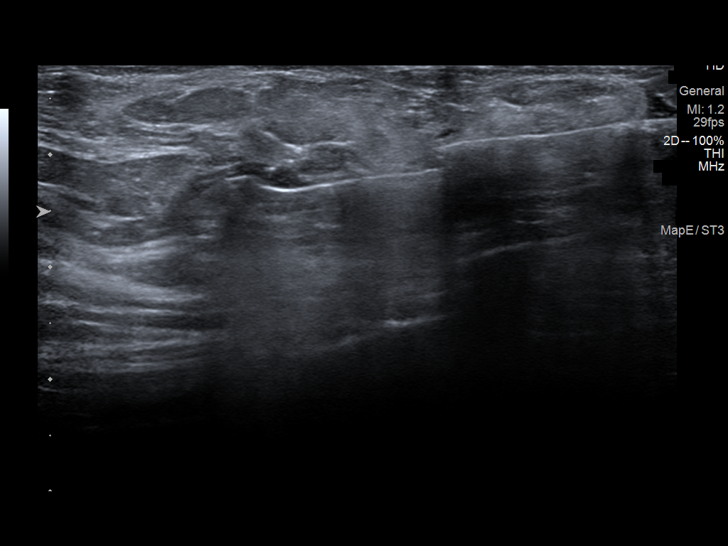
[im 6/11]
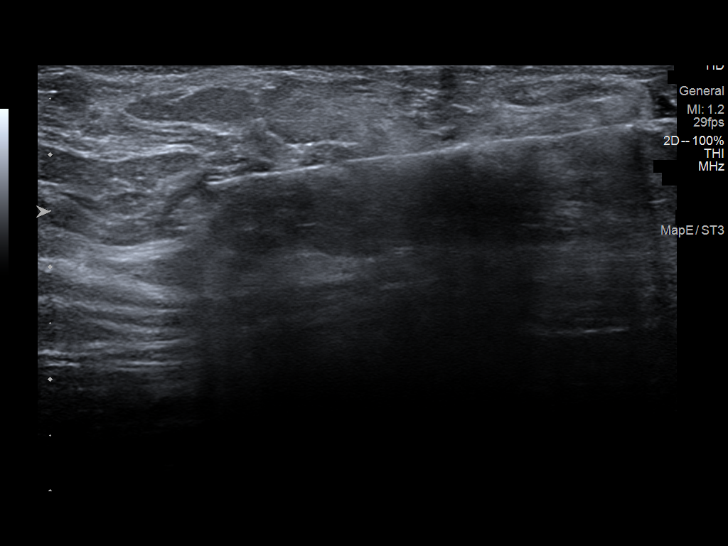
[im 7/11]
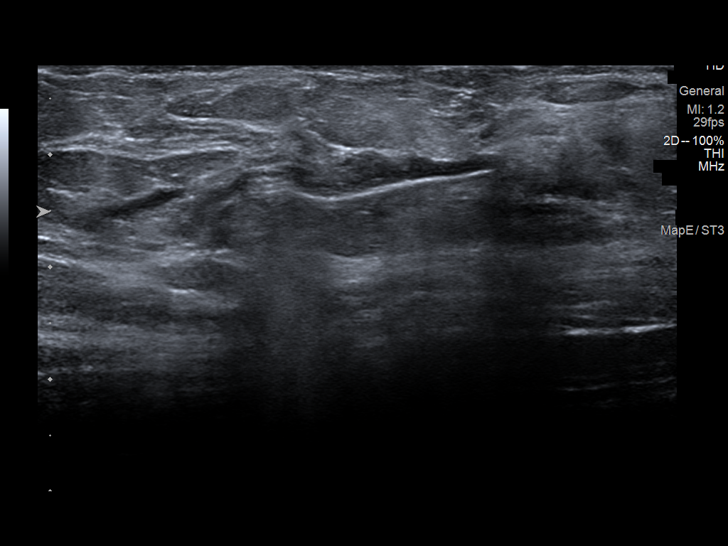
[im 8/11]
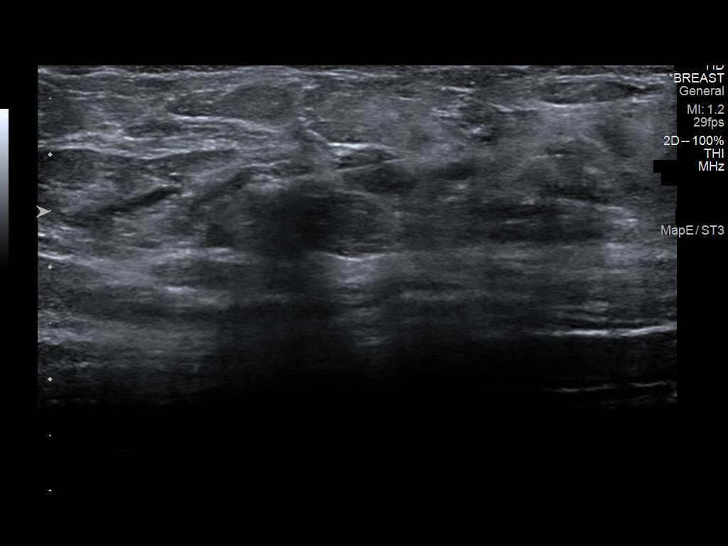
[im 9/11]
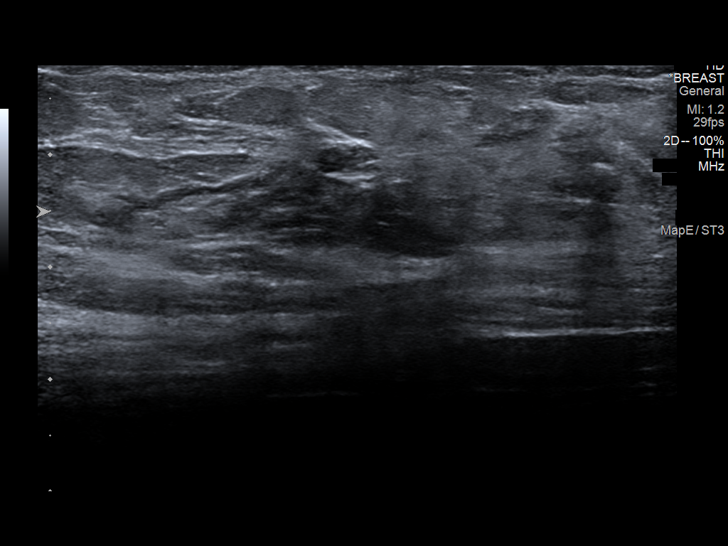
[im 10/11]
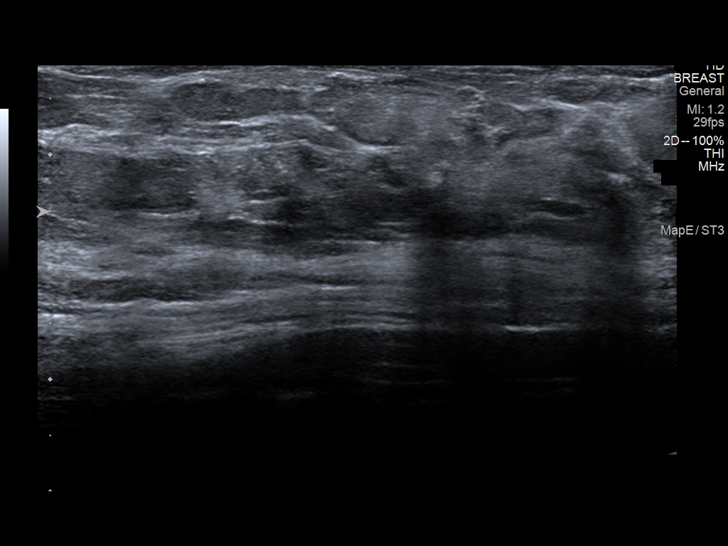
[im 11/11]
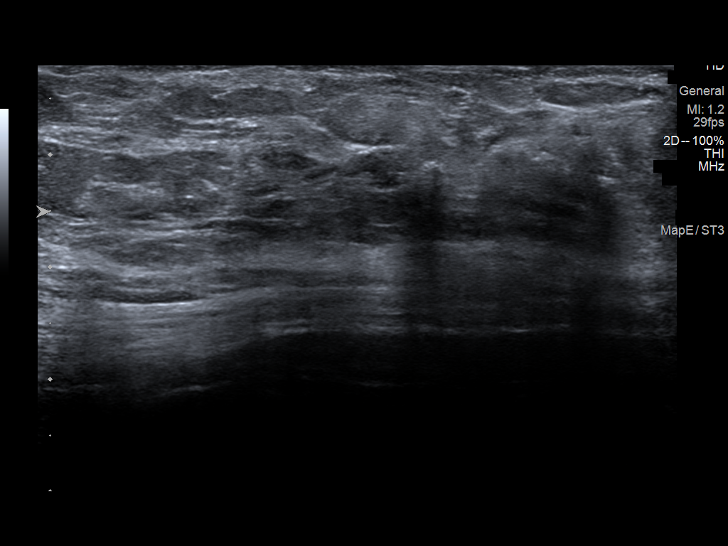

[11 of 11 positions shown; findings below may reference images not displayed]



Lesion quadrant: UPPER-OUTER QUADRANT LEFT breast

Using sterile technique and 1% Lidocaine as local anesthetic, under
direct ultrasound visualization, a 12 gauge Aristy device was
used to perform biopsy of mass in the 2 o'clock location of the LEFT
breast 6 centimeters from the nipple using a LATERAL to MEDIAL
approach. At the conclusion of the procedure ribbon shaped tissue
marker clip was deployed into the biopsy cavity. Follow up 2 view
mammogram was performed and dictated separately.
IMPRESSION: Ultrasound guided biopsy of LEFT breast mass. No apparent
complications.

ADDENDUM:
Pathology revealed FIBROADENOMA of the LEFT breast, 2:00 o'clock,
(ribbon clip). This was found to be concordant by Dr. Gull
Kooratile.

Pathology results were discussed with the patient by telephone. The
patient reported doing well after the biopsy with tenderness at the
site. Post biopsy instructions and care were reviewed and questions
were answered. The patient was encouraged to call The [REDACTED]

The patient was instructed to return for annual screening
mammography in August 2022.

Pathology results reported by Onika Akter Akko, RN on 10/21/2021.



Lesion quadrant: UPPER-OUTER QUADRANT LEFT breast

Using sterile technique and 1% Lidocaine as local anesthetic, under
direct ultrasound visualization, a 12 gauge Aristy device was
used to perform biopsy of mass in the 2 o'clock location of the LEFT
breast 6 centimeters from the nipple using a LATERAL to MEDIAL
approach. At the conclusion of the procedure ribbon shaped tissue
marker clip was deployed into the biopsy cavity. Follow up 2 view
mammogram was performed and dictated separately.
IMPRESSION: Ultrasound guided biopsy of LEFT breast mass. No apparent
complications.

## 2022-12-09 LAB — AMB RESULTS CONSOLE CBG: Glucose: 114

## 2023-01-06 LAB — AMB RESULTS CONSOLE CBG: Glucose: 114

## 2023-01-06 NOTE — Progress Notes (Signed)
Provided listing of local pantries  and encouraged her to download [employer] [place] pant[employer] app as well, as the client indicated that they worried that food would run out before being able to purchase more. Client also, admitted to housing insecurity during a verbal interaction. Provided housing resources.

## 2023-01-12 ENCOUNTER — Encounter: Payer: Self-pay | Admitting: *Deleted

## 2023-01-12 NOTE — Progress Notes (Signed)
Pt attended 01/06/23 screening event when b/p was 170/106 and 162/99 and blood sugar was 114. Pt states she had just exercised that day and was running late trying to leave the [employer] [facility]. Pt  states she has her own b/p cuff at home and latere that day was 103/62. Then pt checked her b/p again on 3/27 and it was 109/66 in the AM and 103/64 in the PM. She states her PCP is still at [facility] but Dr. Jacelyn Grip retired and now she sees Dr. Theadore Nan, with home she has her next appt on 01/19/23. She did identify food and housing insecurites at the event and was given resources for both. She states she is trying to buy a home rather than pay expensive rent and sometimes it is difficult to budget food costs and rent, which is why she is now looking to buy a home for herself. Pt stated she appreciated all the help and follow up. No additional health equity team support indicated at this time.

## 2024-01-27 ENCOUNTER — Other Ambulatory Visit: Payer: Self-pay | Admitting: Orthopedic Surgery

## 2024-02-11 NOTE — Anesthesia Preprocedure Evaluation (Signed)
 Anesthesia Evaluation  Patient identified by MRN, date of birth, ID band Patient awake    Reviewed: Allergy & Precautions, NPO status , Patient's Chart, lab work & pertinent test results  History of Anesthesia Complications (+) PONV and history of anesthetic complications  Airway Mallampati: II  TM Distance: >3 FB Neck ROM: Full    Dental  (+) Loose, Missing, Partial Upper,    Pulmonary neg pulmonary ROS   Pulmonary exam normal        Cardiovascular negative cardio ROS Normal cardiovascular exam     Neuro/Psych    GI/Hepatic   Endo/Other  Hypothyroidism    Renal/GU      Musculoskeletal  (+) Arthritis ,    Abdominal   Peds  Hematology   Anesthesia Other Findings   Reproductive/Obstetrics                              Anesthesia Physical Anesthesia Plan  ASA: 2  Anesthesia Plan: General   Post-op Pain Management: Tylenol  PO (pre-op)*   Induction: Intravenous  PONV Risk Score and Plan: 4 or greater and Midazolam , Treatment may vary due to age or medical condition, Ondansetron , Dexamethasone , Propofol  infusion and TIVA  Airway Management Planned: LMA  Additional Equipment: None  Intra-op Plan:   Post-operative Plan: Extubation in OR  Informed Consent: I have reviewed the patients History and Physical, chart, labs and discussed the procedure including the risks, benefits and alternatives for the proposed anesthesia with the patient or authorized representative who has indicated his/her understanding and acceptance.     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  Patient states she had an issue with palpitations during a dental procedure after being injected with local anesthesia which  required prolonged monitoring at dental office and supplemental O2.  In 2019, had thyroidectomy and patient states she was difficult to arouse in PACU.  Chart review did not reveal any complications post-operatively. 09/2021 had foot surgery under MAC at [facility] w/o issues.  )         Anesthesia Quick Evaluation

## 2024-02-11 NOTE — Progress Notes (Signed)
 Reviewed chart with Dr.Finucane, okay for [facility].

## 2024-02-12 ENCOUNTER — Encounter (HOSPITAL_BASED_OUTPATIENT_CLINIC_OR_DEPARTMENT_OTHER): Payer: Self-pay | Admitting: Orthopedic Surgery

## 2024-02-12 ENCOUNTER — Other Ambulatory Visit: Payer: Self-pay

## 2024-02-17 ENCOUNTER — Encounter (HOSPITAL_BASED_OUTPATIENT_CLINIC_OR_DEPARTMENT_OTHER): Payer: Self-pay | Admitting: Orthopedic Surgery

## 2024-02-17 NOTE — H&P (Cosign Needed)
 Chief Complaint: Right forefoot pain HPI: Patient is a 73 year old female that presents to the operating room today for definitive treatment for her right lateral forefoot pain.  She has failed nonoperative treatment to date including activity modification, oral anti-inflammatories, orthotics and shoewear modification, and elects for surgical intervention.  Allergies:  Allergies  Allergen Reactions   Iodine  Swelling    Face and arm swelling   Kiwi Extract Hives   Other Other (See Comments)    Bleach cause blisters on hands.   Pineapple Hives   Strawberry Extract Hives   Penicillins Rash    Has patient had a PCN reaction causing immediate rash, facial/tongue/throat swelling, SOB or lightheadedness with hypotension: no Has patient had a PCN reaction causing severe rash involving mucus membranes or skin necrosis: no  Has patient had a PCN reaction that required hospitalization: no Has patient had a PCN reaction occurring within the last 10 years: no If all of the above answers are "NO", then may proceed with Cephalosporin use.     Past Medical History:  Diagnosis Date   Arthritis    osteoarthritis   Benign liver cyst 2011   Complication of anesthesia 02/2018   Difficult breathing, tremors, palpitations, cold chills from previous dental procedure   Enlarged thyroid     Goiter    ultrasound in 2014 with fine needle aspiration   History of orthostatic hypotension    Hypothyroidism    PONV (postoperative nausea and vomiting)    Pseudocholinesterase deficiency    Pseudocholinesterase deficiency     Past Surgical History:  Procedure Laterality Date   ANKLE SURGERY Right    BUNIONECTOMY WITH HAMMERTOE RECONSTRUCTION Left 03/03/2019   Procedure: Left Foot Scarf/Akin Osteotomy/Bunion Correction; Left Second Metatarsal Weil and Hammertoe Correction;  Surgeon: Amada Backer, MD;  Location: [facility];  Service: Orthopedics;  Laterality: Left;   HARDWARE REMOVAL Right  03/03/2019   Procedure: Right Ankle Removal Deep Implant Tibia/Fibula ;  Surgeon: Amada Backer, MD;  Location: [facility];  Service: Orthopedics;  Laterality: Right;   HARDWARE REMOVAL Left 09/19/2021   Procedure: Left foot removal of deep implants;  Surgeon: Amada Backer, MD;  Location: [facility];  Service: Orthopedics;  Laterality: Left;   HERNIA REPAIR     THYROIDECTOMY N/A 08/26/2018   Procedure: TOTAL THYROIDECTOMY;  Surgeon: Oralee Billow, MD;  Location: [facility];  Service: General;  Laterality: N/A;   THYROIDECTOMY     TOTAL HIP ARTHROPLASTY Right 07/31/2021   Procedure: TOTAL HIP ARTHROPLASTY ANTERIOR APPROACH;  Surgeon: Adonica Hoose, MD;  Location: [facility];  Service: Orthopedics;  Laterality: Right;    Family History: Family History  Problem Relation Age of Onset   CAD Mother    Goiter Mother    Cancer Mother    AAA (abdominal aortic aneurysm) Father    Pancreatic cancer Sister     Social History:   reports that she has never smoked. She has never used smokeless tobacco. She reports current alcohol  use. She reports that she does not use drugs.  Medications: No medications prior to admission.    No results found for this or any previous visit (from the past 48 hours).  No results found.    Height 5\' 5"  (1.651 m), weight 65.8 kg.  PE:  well nourished and well developed.  NAD.  EOMI.  Resp unlabored.  Right foot tailor's bunion.  Tender to palpation laterally at right 5th metatarsal head.  Assessment/Plan Right foot tailor's bunion  The patient presents for definitive treatment for her right fifth metatarsal tailor's bunion.  She will require correction of 5th MT tailor's bunion with osteotomy and pinning.  After reviewing the procedure, postoperative protocol, and risks of surgery the patient elects for surgical intervention.  The patient specifically understands risks of bleeding, infection, nerve damage, blood clots, need for additional  surgery, continued pain, nonunion, post traumatic arthritis, recurrence of deformity, amputation and death.   Adelfa Adolph PA-C [facility] Office:  [phone]

## 2024-02-18 ENCOUNTER — Ambulatory Visit (HOSPITAL_BASED_OUTPATIENT_CLINIC_OR_DEPARTMENT_OTHER): Payer: Self-pay | Admitting: Anesthesiology

## 2024-02-18 ENCOUNTER — Encounter (HOSPITAL_BASED_OUTPATIENT_CLINIC_OR_DEPARTMENT_OTHER)
Admission: RE | Disposition: A | Discharge status: Home / Self Care | Payer: Self-pay | Source: Home / Self Care | Attending: Orthopedic Surgery

## 2024-02-18 ENCOUNTER — Other Ambulatory Visit: Payer: Self-pay

## 2024-02-18 ENCOUNTER — Encounter (HOSPITAL_BASED_OUTPATIENT_CLINIC_OR_DEPARTMENT_OTHER): Payer: Self-pay | Admitting: Orthopedic Surgery

## 2024-02-18 ENCOUNTER — Ambulatory Visit (HOSPITAL_BASED_OUTPATIENT_CLINIC_OR_DEPARTMENT_OTHER)

## 2024-02-18 ENCOUNTER — Ambulatory Visit (HOSPITAL_BASED_OUTPATIENT_CLINIC_OR_DEPARTMENT_OTHER)
Admission: RE | Admit: 2024-02-18 | Discharge: 2024-02-18 | Disposition: A | Discharge status: Home / Self Care | Attending: Orthopedic Surgery | Admitting: Orthopedic Surgery

## 2024-02-18 DIAGNOSIS — M21621 Bunionette of right foot: Secondary | ICD-10-CM | POA: Diagnosis present

## 2024-02-18 DIAGNOSIS — M1990 Unspecified osteoarthritis, unspecified site: Secondary | ICD-10-CM | POA: Insufficient documentation

## 2024-02-18 HISTORY — PX: METATARSAL OSTEOTOMY: SHX1641

## 2024-02-18 SURGERY — OSTEOTOMY, METATARSAL BONE
Anesthesia: General | Site: Toe | Laterality: Right

## 2024-02-18 MED ORDER — MIDAZOLAM HCL 2 MG/2ML IJ SOLN
INTRAMUSCULAR | Status: AC
  Filled 2024-02-18: qty 2

## 2024-02-18 MED ORDER — ACETAMINOPHEN 500 MG PO TABS
ORAL_TABLET | ORAL | Status: AC
  Filled 2024-02-18: qty 2

## 2024-02-18 MED ORDER — CEFAZOLIN SODIUM-DEXTROSE 2-4 GM/100ML-% IV SOLN
INTRAVENOUS | Status: AC
  Filled 2024-02-18: qty 100

## 2024-02-18 MED ORDER — LIDOCAINE HCL 2 % IJ SOLN
INTRAMUSCULAR | Status: AC
  Filled 2024-02-18: qty 20

## 2024-02-18 MED ORDER — FENTANYL CITRATE (PF) 100 MCG/2ML IJ SOLN
INTRAMUSCULAR | Status: AC
  Filled 2024-02-18: qty 2

## 2024-02-18 MED ORDER — EPHEDRINE SULFATE (PRESSORS) 50 MG/ML IJ SOLN
INTRAMUSCULAR | Status: DC | PRN
Start: 2024-02-18 — End: 2024-02-18
  Administered 2024-02-18 (×2): 10 mg via INTRAVENOUS

## 2024-02-18 MED ORDER — LACTATED RINGERS IV SOLN: INTRAVENOUS | Status: DC | PRN

## 2024-02-18 MED ORDER — PROPOFOL 10 MG/ML IV BOLUS
INTRAVENOUS | Status: AC
  Filled 2024-02-18: qty 20

## 2024-02-18 MED ORDER — LACTATED RINGERS IV SOLN: INTRAVENOUS | Status: DC

## 2024-02-18 MED ORDER — PROPOFOL 500 MG/50ML IV EMUL
INTRAVENOUS | Status: DC | PRN
  Administered 2024-02-18: 200 ug/kg/min via INTRAVENOUS

## 2024-02-18 MED ORDER — LIDOCAINE HCL (CARDIAC) PF 100 MG/5ML IV SOSY
PREFILLED_SYRINGE | INTRAVENOUS | Status: DC | PRN
  Administered 2024-02-18: 100 mg via INTRAVENOUS

## 2024-02-18 MED ORDER — DEXAMETHASONE SODIUM PHOSPHATE 10 MG/ML IJ SOLN
INTRAMUSCULAR | Status: DC | PRN
  Administered 2024-02-18: 5 mg via INTRAVENOUS

## 2024-02-18 MED ORDER — SODIUM CHLORIDE 0.9 % IV SOLN
INTRAVENOUS | Status: AC | PRN
  Administered 2024-02-18: 100 mL

## 2024-02-18 MED ORDER — SENNA 8.6 MG PO TABS: 2.0000 | ORAL_TABLET | Freq: Two times a day (BID) | ORAL | 0 refills | Status: AC

## 2024-02-18 MED ORDER — BUPIVACAINE-EPINEPHRINE 0.5% -1:200000 IJ SOLN
INTRAMUSCULAR | Status: DC | PRN
  Administered 2024-02-18: 10 mL

## 2024-02-18 MED ORDER — DOCUSATE SODIUM 100 MG PO CAPS: 100.0000 mg | ORAL_CAPSULE | Freq: Two times a day (BID) | ORAL | 0 refills | Status: AC

## 2024-02-18 MED ORDER — ONDANSETRON HCL 4 MG/2ML IJ SOLN
INTRAMUSCULAR | Status: DC | PRN
  Administered 2024-02-18: 4 mg via INTRAVENOUS

## 2024-02-18 MED ORDER — FENTANYL CITRATE (PF) 100 MCG/2ML IJ SOLN: 25.0000 ug | INTRAMUSCULAR | Status: DC | PRN

## 2024-02-18 MED ORDER — PHENYLEPHRINE HCL (PRESSORS) 10 MG/ML IV SOLN
INTRAVENOUS | Status: DC | PRN
  Administered 2024-02-18: 80 ug via INTRAVENOUS
  Administered 2024-02-18 (×2): 40 ug via INTRAVENOUS

## 2024-02-18 MED ORDER — OXYCODONE HCL 5 MG PO TABS: 5.0000 mg | ORAL_TABLET | Freq: Four times a day (QID) | ORAL | 0 refills | Status: AC | PRN

## 2024-02-18 MED ORDER — OXYCODONE HCL 5 MG/5ML PO SOLN: 5.0000 mg | Freq: Once | ORAL | Status: DC | PRN

## 2024-02-18 MED ORDER — CEFAZOLIN SODIUM-DEXTROSE 2-3 GM-%(50ML) IV SOLR
INTRAVENOUS | Status: DC | PRN
  Administered 2024-02-18: 2 g via INTRAVENOUS

## 2024-02-18 MED ORDER — PROPOFOL 10 MG/ML IV BOLUS
INTRAVENOUS | Status: DC | PRN
Start: 2024-02-18 — End: 2024-02-18
  Administered 2024-02-18: 130 mg via INTRAVENOUS

## 2024-02-18 MED ORDER — BUPIVACAINE-EPINEPHRINE (PF) 0.5% -1:200000 IJ SOLN
INTRAMUSCULAR | Status: AC
  Filled 2024-02-18: qty 120

## 2024-02-18 MED ORDER — VANCOMYCIN HCL 500 MG IV SOLR
INTRAVENOUS | Status: AC
  Filled 2024-02-18: qty 30

## 2024-02-18 MED ORDER — ACETAMINOPHEN 500 MG PO TABS
1000.0000 mg | ORAL_TABLET | Freq: Once | ORAL | Status: AC
  Administered 2024-02-18: 1000 mg via ORAL

## 2024-02-18 MED ORDER — LIDOCAINE 2% (20 MG/ML) 5 ML SYRINGE
INTRAMUSCULAR | Status: AC
  Filled 2024-02-18: qty 5

## 2024-02-18 MED ORDER — GLYCOPYRROLATE 0.2 MG/ML IJ SOLN
INTRAMUSCULAR | Status: DC | PRN
  Administered 2024-02-18: .2 mg via INTRAVENOUS

## 2024-02-18 MED ORDER — CEFAZOLIN SODIUM-DEXTROSE 2-4 GM/100ML-% IV SOLN: 2.0000 g | INTRAVENOUS | Status: DC

## 2024-02-18 MED ORDER — OXYCODONE HCL 5 MG PO TABS: 5.0000 mg | ORAL_TABLET | Freq: Once | ORAL | Status: DC | PRN

## 2024-02-18 MED ORDER — FENTANYL CITRATE (PF) 100 MCG/2ML IJ SOLN
INTRAMUSCULAR | Status: DC | PRN
  Administered 2024-02-18: 50 ug via INTRAVENOUS

## 2024-02-18 MED ORDER — DROPERIDOL 2.5 MG/ML IJ SOLN: 0.6250 mg | Freq: Once | INTRAMUSCULAR | Status: DC | PRN

## 2024-02-18 SURGICAL SUPPLY — 55 items
BANDAGE ESMARK 6X9 LF (GAUZE/BANDAGES/DRESSINGS) IMPLANT
BLADE AVERAGE 25X9 (BLADE) IMPLANT
BLADE LONG MED 25X9 (BLADE) IMPLANT
BLADE MINI RND TIP GREEN BEAV (BLADE) IMPLANT
BLADE OSC/SAG .038X5.5 CUT EDG (BLADE) IMPLANT
BLADE SURG 15 STRL LF DISP TIS (BLADE) ×2 IMPLANT
BNDG ELASTIC 4INX 5YD STR LF (GAUZE/BANDAGES/DRESSINGS) ×1 IMPLANT
BNDG ELASTIC 6INX 5YD STR LF (GAUZE/BANDAGES/DRESSINGS) ×1 IMPLANT
BNDG STRETCH GAUZE 3IN X12FT (GAUZE/BANDAGES/DRESSINGS) ×1 IMPLANT
BUR MIS STRT 2.0X13 (BURR) IMPLANT
CHLORAPREP W/TINT 26 (MISCELLANEOUS) ×1 IMPLANT
COVER BACK TABLE 60X90IN (DRAPES) ×1 IMPLANT
CUFF TRNQT CYL 24X4X16.5-23 (TOURNIQUET CUFF) IMPLANT
CUFF TRNQT CYL 34X4.125X (TOURNIQUET CUFF) IMPLANT
DRAPE EXTREMITY T 121X128X90 (DISPOSABLE) ×1 IMPLANT
DRAPE OEC MINIVIEW 54X84 (DRAPES) ×1 IMPLANT
DRAPE U-SHAPE 47X51 STRL (DRAPES) ×1 IMPLANT
DRSG MEPITEL 4X7.2 (GAUZE/BANDAGES/DRESSINGS) ×1 IMPLANT
ELECTRODE REM PT RTRN 9FT ADLT (ELECTROSURGICAL) ×1 IMPLANT
GAUZE PAD ABD 8X10 STRL (GAUZE/BANDAGES/DRESSINGS) ×1 IMPLANT
GAUZE SPONGE 4X4 12PLY STRL (GAUZE/BANDAGES/DRESSINGS) ×1 IMPLANT
GLOVE BIO SURGEON STRL SZ 6.5 (GLOVE) IMPLANT
GLOVE BIO SURGEON STRL SZ8 (GLOVE) ×1 IMPLANT
GLOVE BIOGEL PI IND STRL 6.5 (GLOVE) IMPLANT
GLOVE BIOGEL PI IND STRL 7.0 (GLOVE) IMPLANT
GLOVE BIOGEL PI IND STRL 8 (GLOVE) ×2 IMPLANT
GLOVE ECLIPSE 8.0 STRL XLNG CF (GLOVE) ×1 IMPLANT
GOWN STRL REUS W/ TWL LRG LVL3 (GOWN DISPOSABLE) ×1 IMPLANT
GOWN STRL REUS W/ TWL XL LVL3 (GOWN DISPOSABLE) ×2 IMPLANT
KWIRE DBL .054X9 NSTRL (WIRE) IMPLANT
KWIRE DBL TROCAR .062X9 (WIRE) IMPLANT
NDL HYPO 22X1.5 SAFETY MO (MISCELLANEOUS) IMPLANT
NEEDLE HYPO 22X1.5 SAFETY MO (MISCELLANEOUS) IMPLANT
NS IRRIG 1000ML POUR BTL (IV SOLUTION) ×1 IMPLANT
PACK BASIN DAY SURGERY FS (CUSTOM PROCEDURE TRAY) ×1 IMPLANT
PAD CAST 4YDX4 CTTN HI CHSV (CAST SUPPLIES) ×1 IMPLANT
PADDING CAST ABS COTTON 4X4 ST (CAST SUPPLIES) IMPLANT
PENCIL SMOKE EVACUATOR (MISCELLANEOUS) ×1 IMPLANT
SANITIZER HAND PURELL FF 515ML (MISCELLANEOUS) ×1 IMPLANT
SET IRRIGATION TUBING (TUBING) IMPLANT
SHEET MEDIUM DRAPE 40X70 STRL (DRAPES) ×1 IMPLANT
SLEEVE SCD COMPRESS KNEE MED (STOCKING) ×1 IMPLANT
SPONGE T-LAP 18X18 ~~LOC~~+RFID (SPONGE) ×1 IMPLANT
STOCKINETTE 6 STRL (DRAPES) ×1 IMPLANT
SUCTION TUBE FRAZIER 10FR DISP (SUCTIONS) ×1 IMPLANT
SUT ETHILON 3 0 PS 1 (SUTURE) ×1 IMPLANT
SUT MNCRL AB 3-0 PS2 18 (SUTURE) ×1 IMPLANT
SUT VIC AB 2-0 SH 27XBRD (SUTURE) IMPLANT
SUT VICRYL 0 SH 27 (SUTURE) IMPLANT
SUT VICRYL 0 UR6 27IN ABS (SUTURE) IMPLANT
SYR BULB EAR ULCER 3OZ GRN STR (SYRINGE) ×1 IMPLANT
SYR CONTROL 10ML LL (SYRINGE) IMPLANT
TOWEL GREEN STERILE FF (TOWEL DISPOSABLE) ×1 IMPLANT
TUBE CONNECTING 20X1/4 (TUBING) ×1 IMPLANT
UNDERPAD 30X36 HEAVY ABSORB (UNDERPADS AND DIAPERS) ×1 IMPLANT

## 2024-02-18 NOTE — Op Note (Signed)
 02/18/2024  8:20 AM  PATIENT:  Angela Patrick  73 y.o. female  PRE-OPERATIVE DIAGNOSIS:  right foot tailor's bunion  POST-OPERATIVE DIAGNOSIS:  same  Procedure(s): 1.  Right 5th MT head exostectomy   2.  Right 5th MT tailor's bunion correction with osteotomy   3.  Right foot AP, oblique and lateral xrays  SURGEON:  Amada Backer, MD  ASSISTANT: Adelfa Adolph, PA-C  ANESTHESIA:   General, local  EBL:  minimal   TOURNIQUET:  none  COMPLICATIONS:  None apparent  DISPOSITION:  Extubated, awake and stable to recovery.  INDICATION FOR PROCEDURE: 73 year old female without significant past medical history complains of worsening right foot pain at the plantar and lateral fifth metatarsal head.  Radiographs reveal a type I tailor's bunion.  She has failed nonoperative treatment to date including activity modification, oral anti-inflammatories and shoewear modification.  She presents today for surgical treatment of this painful and limiting condition.  The risks and benefits of the alternative treatment options have been discussed in detail.  The patient wishes to proceed with surgery and specifically understands risks of bleeding, infection, nerve damage, blood clots, need for additional surgery, amputation and death.   PROCEDURE IN DETAIL:  After pre operative consent was obtained, and the correct operative site was identified, the patient was brought to the operating room and placed supine on the OR table.  Anesthesia was administered.  Pre-operative antibiotics were administered.  A surgical timeout was taken.  The right lower extremity was prepped and draped in standard sterile fashion.  A metatarsal block was performed with half percent Marcaine  with epinephrine .  An AP radiograph was obtained of the right foot showing the prominent hypertrophic lateral eminence.  A small incision was made just proximal from the lateral eminence at the glabrous border.  A periosteal elevator was then passed  dorsally and plantarly to the metatarsal neck.  It was then advanced laterally along the hypertrophic lateral eminence.  A 2 x 13 mm Shannon bur was then inserted from proximal to distal in the lateral eminence.  Under fluoroscopic guidance it was then used to resect the prominent bone in line with the metatarsal shaft.  All bone paste was expressed from the wound after irrigating.  The bur was then used to create a transverse osteotomy in the metatarsal neck.  The osteotomy was mobilized with an elevator.  The head was translated medially with a curved hemostat.  A 0.0625 K wire was then introduced adjacent to the metatarsal head.  It was advanced up the metatarsal canal under fluoroscopic guidance until it engaged the proximal subchondral bone.  AP, lateral and oblique radiographs showed appropriate translation of the metatarsal head dorsally and medially.  The pin was then bent, trimmed and capped.  The wound was again irrigated.  It was closed with nylon.  Sterile dressings were applied followed by a compression wrap.  The patient was awakened from anesthesia and transported to the recovery room in stable condition.  FOLLOW UP PLAN: Weightbearing as tolerated in a flat postop shoe.  Follow-up in the office in 2 weeks for suture removal.  No indication for DVT prophylaxis in this ambulatory patient.  RADIOGRAPHS: AP, lateral and oblique radiographs of the right foot are obtained intraoperatively.  These show interval correction of the tailor's bunion with removal of the hypertrophic lateral eminence and corrective osteotomy of the fifth metatarsal neck.  Hardware is appropriately positioned and of the appropriate lengths.  No other acute injuries are noted.  Justin Ollis PA-C was present and scrubbed for the duration of the operative case. His assistance was essential in positioning the patient, prepping and draping, gaining and maintaining exposure, performing the operation, closing and dressing the  wounds and applying the splint.

## 2024-02-18 NOTE — Anesthesia Postprocedure Evaluation (Signed)
 Anesthesia Post Note  Patient: Angela Patrick  Procedure(s) Performed: CORRECTION OF FIFTH METATARSAL TAILOR'S BUNION WITH OSTEOTOMY PINNING (Right: Toe)     Patient location during evaluation: PACU Anesthesia Type: General Level of consciousness: awake and alert Pain management: pain level controlled Vital Signs Assessment: post-procedure vital signs reviewed and stable Respiratory status: spontaneous breathing, nonlabored ventilation and respiratory function stable Cardiovascular status: blood pressure returned to baseline Postop Assessment: no apparent nausea or vomiting Anesthetic complications: no   No notable events documented.  Last Vitals:  Vitals:   02/18/24 0815 02/18/24 0830  BP: 101/66 107/72  Pulse:  81  Resp: 16 (!) 22  Temp: (!) 36.4 C   SpO2: 100% 100%    Last Pain:  Vitals:   02/18/24 0845  PainSc: 0-No pain                 Rayfield Cairo

## 2024-02-18 NOTE — Anesthesia Procedure Notes (Signed)
 Procedure Name: LMA Insertion Date/Time: 02/18/2024 7:51 AM  Performed by: Raymona Caldwell, CRNAPre-anesthesia Checklist: Patient identified, Emergency Drugs available, Suction available and Patient being monitored Patient Re-evaluated:Patient Re-evaluated prior to induction Oxygen Delivery Method: Circle system utilized Preoxygenation: Pre-oxygenation with 100% oxygen Induction Type: IV induction Ventilation: Mask ventilation without difficulty LMA: LMA inserted LMA Size: 4.0 Number of attempts: 1 Airway Equipment and Method: Bite block Placement Confirmation: positive ETCO2 Tube secured with: Tape Dental Injury: Teeth and Oropharynx as per pre-operative assessment  Comments: Lma 3 too small so exchanged for a 4 which seated easily

## 2024-02-18 NOTE — Discharge Instructions (Addendum)
 No tylenol  until 12:45 p.m.  Amada Backer, MD [facility]  Please read the following information regarding your care after surgery.  Medications  You only need a prescription for the narcotic pain medicine (ex. oxycodone , Percocet, Norco).  All of the other medicines listed below are available over the counter. ? Aleve 2 pills twice a day for the first 3 days after surgery. ? acetominophen (Tylenol ) 650 mg every 4-6 hours as you need for minor to moderate pain ? oxycodone  as prescribed for severe pain  Narcotic pain medicine (ex. oxycodone , Percocet, Vicodin) will cause constipation.  To prevent this problem, take the following medicines while you are taking any pain medicine. ? docusate sodium  (Colace) 100 mg twice a day ? senna (Senokot) 2 tablets twice a day  Weight Bearing ? Bear weight only on your operated foot in the post-op shoe.   Cast / Splint / Dressing ? Keep your splint, cast or dressing clean and dry.  Don't put anything (coat hanger, pencil, etc) down inside of it.  If it gets damp, use a hair dryer on the cool setting to dry it.  If it gets soaked, call the office to schedule an appointment for a cast change.   After your dressing, cast or splint is removed; you may shower, but do not soak or scrub the wound.  Allow the water  to run over it, and then gently pat it dry.  Swelling It is normal for you to have swelling where you had surgery.  To reduce swelling and pain, keep your toes above your nose for at least 3 days after surgery.  It may be necessary to keep your foot or leg elevated for several weeks.  If it hurts, it should be elevated.  Follow Up Call my office at [phone] when you are discharged from the hospital or surgery center to schedule an appointment to be seen two weeks after surgery.   Post Anesthesia Home Care Instructions  Activity: Get plenty of rest for the remainder of the day. A responsible individual must stay with you for 24 hours  following the procedure.  For the next 24 hours, DO NOT: -Drive a car -Advertising copywriter -Drink alcoholic beverages -Take any medication unless instructed by your physician -Make any legal decisions or sign important papers.  Meals: Start with liquid foods such as gelatin or soup. Progress to regular foods as tolerated. Avoid greasy, spicy, heavy foods. If nausea and/or vomiting occur, drink only clear liquids until the nausea and/or vomiting subsides. Call your physician if vomiting continues.  Special Instructions/Symptoms: Your throat may feel dry or sore from the anesthesia or the breathing tube placed in your throat during surgery. If this causes discomfort, gargle with warm salt water . The discomfort should disappear within 24 hours.  If you had a scopolamine  patch placed behind your ear for the management of post- operative nausea and/or vomiting:  1. The medication in the patch is effective for 72 hours, after which it should be removed.  Wrap patch in a tissue and discard in the trash. Wash hands thoroughly with soap and water . 2. You may remove the patch earlier than 72 hours if you experience unpleasant side effects which may include dry mouth, dizziness or visual disturbances. 3. Avoid touching the patch. Wash your hands with soap and water  after contact with the patch.     Call my office at [phone] if you develop a fever >101.5 F, nausea, vomiting, bleeding from the surgical site or severe pain.

## 2024-02-18 NOTE — Transfer of Care (Signed)
 Immediate Anesthesia Transfer of Care Note  Patient: Angela Patrick  Procedure(s) Performed: CORRECTION OF FIFTH METATARSAL TAILOR'S BUNION WITH OSTEOTOMY PINNING (Right: Toe)  Patient Location: PACU  Anesthesia Type:General  Level of Consciousness: awake, alert , and oriented  Airway & Oxygen Therapy: Patient Spontanous Breathing and Patient connected to face mask oxygen  Post-op Assessment: Report given to RN and Post -op Vital signs reviewed and stable  Post vital signs: Reviewed and stable  Last Vitals:  Vitals Value Taken Time  BP    Temp    Pulse    Resp    SpO2      Last Pain:  Vitals:   02/18/24 0645  PainSc: 8       Patients Stated Pain Goal: 5 (02/18/24 0645)  Complications: No notable events documented.

## 2024-02-19 ENCOUNTER — Encounter (HOSPITAL_BASED_OUTPATIENT_CLINIC_OR_DEPARTMENT_OTHER): Payer: Self-pay | Admitting: Orthopedic Surgery
